# Patient Record
Sex: Female | Born: 1990 | Race: Black or African American | Hispanic: No | Marital: Single | Smoking: Former smoker
Health system: Southern US, Community
[De-identification: ages and names within clinical notes are randomized; demographics above are authoritative.]

## PROBLEM LIST (undated history)

## (undated) ENCOUNTER — Inpatient Hospital Stay (HOSPITAL_COMMUNITY): Payer: Self-pay

## (undated) DIAGNOSIS — F329 Major depressive disorder, single episode, unspecified: Secondary | ICD-10-CM

## (undated) DIAGNOSIS — A749 Chlamydial infection, unspecified: Secondary | ICD-10-CM

## (undated) DIAGNOSIS — F419 Anxiety disorder, unspecified: Secondary | ICD-10-CM

## (undated) DIAGNOSIS — E162 Hypoglycemia, unspecified: Secondary | ICD-10-CM

## (undated) DIAGNOSIS — F32A Depression, unspecified: Secondary | ICD-10-CM

## (undated) HISTORY — PX: NO PAST SURGERIES: SHX2092

---

## 2011-03-05 ENCOUNTER — Emergency Department (HOSPITAL_COMMUNITY): Payer: Federal, State, Local not specified - PPO

## 2011-03-05 ENCOUNTER — Emergency Department (HOSPITAL_COMMUNITY)
Admission: EM | Admit: 2011-03-05 | Discharge: 2011-03-05 | Disposition: A | Discharge status: Home / Self Care | Payer: Federal, State, Local not specified - PPO | Attending: Emergency Medicine | Admitting: Emergency Medicine

## 2011-03-05 ENCOUNTER — Encounter (HOSPITAL_COMMUNITY): Payer: Self-pay | Admitting: *Deleted

## 2011-03-05 ENCOUNTER — Other Ambulatory Visit: Payer: Self-pay

## 2011-03-05 DIAGNOSIS — R079 Chest pain, unspecified: Secondary | ICD-10-CM | POA: Insufficient documentation

## 2011-03-05 HISTORY — DX: Hypoglycemia, unspecified: E16.2

## 2011-03-05 HISTORY — DX: Anxiety disorder, unspecified: F41.9

## 2011-03-05 LAB — POCT I-STAT, CHEM 8
BUN: 13 mg/dL (ref 6–23)
Calcium, Ion: 1.22 mmol/L (ref 1.12–1.32)
Chloride: 107 mEq/L (ref 96–112)
Creatinine, Ser: 0.8 mg/dL (ref 0.50–1.10)
Glucose, Bld: 84 mg/dL (ref 70–99)
HCT: 42 % (ref 36.0–46.0)
Hemoglobin: 14.3 g/dL (ref 12.0–15.0)
Potassium: 3.7 mEq/L (ref 3.5–5.1)
Sodium: 139 mEq/L (ref 135–145)
TCO2: 24 mmol/L (ref 0–100)

## 2011-03-05 LAB — D-DIMER, QUANTITATIVE: D-Dimer, Quant: <0.22 ug/mL-FEU (ref 0.00–0.48)

## 2011-03-05 MED ORDER — IBUPROFEN 800 MG PO TABS
800.0000 mg | ORAL_TABLET | Freq: Once | ORAL | Status: AC
  Administered 2011-03-05: 800 mg via ORAL
  Filled 2011-03-05: qty 1

## 2011-03-05 MED ORDER — HYDROCODONE-ACETAMINOPHEN 5-325 MG PO TABS
1.0000 | ORAL_TABLET | Freq: Once | ORAL | Status: AC
  Administered 2011-03-05: 1 via ORAL
  Filled 2011-03-05: qty 1

## 2011-03-05 MED ORDER — HYDROCODONE-ACETAMINOPHEN 5-325 MG PO TABS: 1.0000 | ORAL_TABLET | Freq: Four times a day (QID) | ORAL | Status: AC | PRN

## 2011-03-05 NOTE — ED Notes (Signed)
Pt from home c/o pain under left breast that started 2 months ago, pain has been "coming and going" but has been getting worse over the last 24 hours. Pt endorses "a little" nausea and dizziness "off and on" but denies emesis. Pt denies recent injury or surgery.

## 2011-03-05 NOTE — ED Provider Notes (Signed)
History     CSN: [id number]  Arrival date & time 03/05/11  1421   First MD Initiated Contact with Patient 03/05/11 1524      Chief Complaint  Patient presents with  . Pleurisy    L rib cage area, under L breast    (Consider location/radiation/quality/duration/timing/severity/associated sxs/prior treatment) HPI Patient is a 21 yo F who presents complaining of 2 months of 8/10 sharp chest pain located underneath the left breast without radiation.  She is here today as she thinks this has gotten acutely worse in the past 24 hours.  She denies cough, fever, history of DVT or PE, history of CAD, early family history of CAD, or smoking.  There are no modifying or alleviating factors. Past Medical History  Diagnosis Date  . Hypoglycemia   . Anxiety     History reviewed. No pertinent past surgical history.  History reviewed. No pertinent family history.  History  Substance Use Topics  . Smoking status: Passive Smoker    Types: Cigarettes  . Smokeless tobacco: Never Used  . Alcohol Use: Yes     occ    OB History    Grav Para Term Preterm Abortions TAB SAB Ect Mult Living                  Review of Systems  Constitutional: Negative.   HENT: Negative.   Eyes: Negative.   Respiratory: Negative.   Cardiovascular: Positive for chest pain.  Gastrointestinal: Negative.   Genitourinary: Negative.   Musculoskeletal: Negative.   Skin: Negative.   Neurological: Negative.   Hematological: Negative.   Psychiatric/Behavioral: Negative.   All other systems reviewed and are negative.    Allergies  Food  Home Medications   Current Outpatient Rx  Name Route Sig Dispense Refill  . IBUPROFEN 200 MG PO TABS Oral Take 400 mg by mouth every 6 (six) hours as needed. pain    . LEVONORGESTREL-ETHINYL ESTRAD 0.1-20 MG-MCG PO TABS Oral Take 1 tablet by mouth daily.      BP 134/74  Pulse 78  Temp(Src) 98.8 F (37.1 C) (Oral)  Resp 16  Ht 5' 6.5" (1.689 m)  Wt 135 lb (61.236 kg)   BMI 21.46 kg/m2  SpO2 99%  LMP 02/19/2011  Physical Exam  Nursing note and vitals reviewed. Constitutional: She is oriented to person, place, and time. She appears well-developed and well-nourished. No distress.  HENT:  Head: Normocephalic and atraumatic.  Eyes: Conjunctivae and EOM are normal. Pupils are equal, round, and reactive to light.  Neck: Normal range of motion.  Cardiovascular: Normal rate, regular rhythm, normal heart sounds and intact distal pulses.  Exam reveals no gallop and no friction rub.   No murmur heard. Pulmonary/Chest: Effort normal and breath sounds normal. No respiratory distress. She has no wheezes. She has no rales.  Abdominal: Soft. Bowel sounds are normal. She exhibits no distension. There is no tenderness. There is no rebound and no guarding.  Musculoskeletal: Normal range of motion.  Neurological: She is alert and oriented to person, place, and time. No cranial nerve deficit. She exhibits normal muscle tone. Coordination normal.  Skin: Skin is warm and dry. No rash noted. No erythema.  Psychiatric: She has a normal mood and affect.    ED Course  Procedures (including critical care time)   Date: 03/13/2011  Rate: 64  Rhythm: normal sinus rhythm  QRS Axis: normal  Intervals: normal  ST/T Wave abnormalities: normal  Conduction Disutrbances:none  Narrative Interpretation:  Old EKG Reviewed: none available   Labs Reviewed  D-DIMER, QUANTITATIVE  POCT I-STAT, CHEM 8  LAB REPORT - SCANNED   No results found. DG CHEST 2 VIEW   Final Result:        1. Chest pain       MDM  Patient was well-appearing female in no acute distress.  Given her complaints CXR and ECG were performed.  Additionally i-stat chem 8 was performed to check for anemia. D-dimer was also performed and was negative.  Patient was treated with vicodin and ibuprofen.  Given the lack of obvious pathology causing the patient's symptoms she can continue OTC meds for her symptoms.   She was discharged in good condition.        Cyndra Numbers, MD 03/13/11 1130

## 2011-03-05 NOTE — ED Notes (Signed)
Pt states she developed left chest/breast pain a couple of months ago. States thought pain was gas related because severity was a 3 or 4. States that she bent over to put her pants on today and felt a sharp pain. States that pain is 10/10 with movement. Pt reports taking ibuprofen with little relief. Pt does report having a cough a couple of months ago when chest wall pain.

## 2011-09-14 ENCOUNTER — Encounter (HOSPITAL_COMMUNITY): Payer: Self-pay | Admitting: *Deleted

## 2011-09-14 ENCOUNTER — Inpatient Hospital Stay (HOSPITAL_COMMUNITY)
Admission: AD | Admit: 2011-09-14 | Discharge: 2011-09-14 | Disposition: A | Discharge status: Home / Self Care | Payer: Federal, State, Local not specified - PPO | Source: Ambulatory Visit | Attending: Obstetrics & Gynecology | Admitting: Obstetrics & Gynecology

## 2011-09-14 DIAGNOSIS — N949 Unspecified condition associated with female genital organs and menstrual cycle: Secondary | ICD-10-CM | POA: Insufficient documentation

## 2011-09-14 DIAGNOSIS — N921 Excessive and frequent menstruation with irregular cycle: Secondary | ICD-10-CM

## 2011-09-14 DIAGNOSIS — N925 Other specified irregular menstruation: Secondary | ICD-10-CM | POA: Insufficient documentation

## 2011-09-14 DIAGNOSIS — N938 Other specified abnormal uterine and vaginal bleeding: Secondary | ICD-10-CM | POA: Insufficient documentation

## 2011-09-14 HISTORY — DX: Chlamydial infection, unspecified: A74.9

## 2011-09-14 LAB — POCT PREGNANCY, URINE: Preg Test, Ur: NEGATIVE

## 2011-09-14 LAB — WET PREP, GENITAL
Clue Cells Wet Prep HPF POC: NONE SEEN
Trich, Wet Prep: NONE SEEN
Yeast Wet Prep HPF POC: NONE SEEN

## 2011-09-14 MED ORDER — LEVONORGESTREL-ETHINYL ESTRAD 0.1-20 MG-MCG PO TABS: 1.0000 | ORAL_TABLET | Freq: Every day | ORAL | Status: DC

## 2011-09-14 NOTE — MAU Note (Signed)
Patient states she has had some spotting daily since 8-3 after having a normal period on 7-24. Is taking oral contraceptives. Has had a little cramping off and on but none now.

## 2011-09-14 NOTE — MAU Provider Note (Signed)
Veronica Beasley y.o.G0P0 Chief Complaint  Patient presents with  . Vaginal Bleeding     First Provider Initiated Contact with Patient 09/14/11 1311      SUBJECTIVE  HPI: Presents with a six-day history of light vaginal bleeding. She's on week 2 of Alesse  pill pack. Of note she missed one pill last month and the next day took 2. Concerned that she's never had  break through bleeding before while on oral contraceptives and has been taking them for 6 years. No abnormal Pap smears in the past. Denies new sexual partner. No irritative vaginal discharge. Would like prescription refill. Primary care is at [facility] urgent care.  Past Medical History  Diagnosis Date  . Hypoglycemia   . Chlamydia   . Anxiety     denies   Past Surgical History  Procedure Date  . No past surgeries    History   Social History  . Marital Status: Single    Spouse Name: N/A    Number of Children: N/A  . Years of Education: N/A   Occupational History  . Not on file.   Social History Main Topics  . Smoking status: Former Games developer  . Smokeless tobacco: Never Used   Comment: briefly at age 83  . Alcohol Use: Yes     occ  . Drug Use: No  . Sexually Active: Yes    Birth Control/ Protection: Pill   Other Topics Concern  . Not on file   Social History Narrative  . No narrative on file   No current facility-administered medications on file prior to encounter.   Current Outpatient Prescriptions on File Prior to Encounter  Medication Sig Dispense Refill  . levonorgestrel-ethinyl estradiol (AVIANE,ALESSE,LESSINA) 0.1-20 MG-MCG tablet Take 1 tablet by mouth daily.       Allergies  Allergen Reactions  . Food Hives and Itching    Ceasar Dressing    ROS: Pertinent items in HPI  OBJECTIVE Blood pressure 144/88, pulse 84, temperature 99.4 F (37.4 C), temperature source Oral, resp. rate 16, height 5' 5.5" (1.664 m), weight 60.873 kg (134 lb 3.2 oz), last menstrual period 08/30/2011, SpO2  100.00%.  GENERAL: Well-developed, well-nourished female in no acute distress.  HEENT: Normocephalic, good dentition HEART: normal rate RESP: normal effort ABDOMEN: Soft, nontender EXTREMITIES: Nontender, no edema NEURO: Alert and oriented SPECULUM EXAM: NEFG, physiologic discharge, scant blood noted, cervix clean BIMANUAL: cervix nulliparous; uterus NSSP; no adnexal tenderness or masses   LAB RESULTS  Results for orders placed during the hospital encounter of 09/14/11 (from the past 24 hour(s))  POCT PREGNANCY, URINE     Status: Normal   Collection Time   09/14/11 12:37 PM      Component Value Range   Preg Test, Ur NEGATIVE  NEGATIVE  WET PREP, GENITAL     Status: Abnormal   Collection Time   09/14/11  1:20 PM      Component Value Range   Yeast Wet Prep HPF POC NONE SEEN  NONE SEEN   Trich, Wet Prep NONE SEEN  NONE SEEN   Clue Cells Wet Prep HPF POC NONE SEEN  NONE SEEN   WBC, Wet Prep HPF POC FEW (*) NONE SEEN       ASSESSMENT  Breakthrough bleeding on oral contraceptives due to missed pill  PLAN   Medication List  As of 09/14/2011  1:24 PM   ASK your doctor about these medications         levonorgestrel-ethinyl estradiol 0.1-20 MG-MCG tablet  Commonly known as: AVIANE,ALESSE,LESSINA   Take 1 tablet by mouth daily.             Follow-up Information    Schedule an appointment as soon as possible for a visit in 3 months to follow up. (Followup with primary M.D. at [facility] if breakthrough bleeding continues)        GC/CT sent Refilled levonorgestrel-ethinyl estradiol .1-20 mg-mcg x 6 months Reviewed pill regimen Use back up method for 1 cycle    Mardell Suttles 09/14/2011 1:24 PM

## 2011-09-14 NOTE — MAU Note (Signed)
Mid cycle bleeding, spotting for past 6 days. Has been on birth control pills off and on since age 21, this type for almost a year, never had irreg bleeding.  occ mild cramping in lower abd, none today

## 2011-09-15 LAB — GC/CHLAMYDIA PROBE AMP, GENITAL
Chlamydia, DNA Probe: NEGATIVE
GC Probe Amp, Genital: NEGATIVE

## 2011-10-24 ENCOUNTER — Ambulatory Visit (INDEPENDENT_AMBULATORY_CARE_PROVIDER_SITE_OTHER): Payer: Federal, State, Local not specified - PPO | Admitting: Emergency Medicine

## 2011-10-24 VITALS — BP 118/78 | HR 60 | Temp 98.8°F | Resp 16 | Ht 66.5 in | Wt 134.0 lb

## 2011-10-24 DIAGNOSIS — S335XXA Sprain of ligaments of lumbar spine, initial encounter: Secondary | ICD-10-CM

## 2011-10-24 DIAGNOSIS — Z309 Encounter for contraceptive management, unspecified: Secondary | ICD-10-CM

## 2011-10-24 MED ORDER — CYCLOBENZAPRINE HCL 10 MG PO TABS: 10.0000 mg | ORAL_TABLET | Freq: Three times a day (TID) | ORAL | Status: DC | PRN

## 2011-10-24 MED ORDER — NAPROXEN SODIUM 550 MG PO TABS: 550.0000 mg | ORAL_TABLET | Freq: Two times a day (BID) | ORAL | Status: DC

## 2011-10-24 MED ORDER — LEVONORGESTREL-ETHINYL ESTRAD 0.1-20 MG-MCG PO TABS: 1.0000 | ORAL_TABLET | Freq: Every day | ORAL | Status: DC

## 2011-10-24 NOTE — Progress Notes (Signed)
   Date:  10/24/2011   Name:  Veronica Beasley   DOB:  [birth year]   MRN:  [id number] Gender: female Age: 21 y.o.  PCP:  No primary provider on file.    Chief Complaint: Back Pain and Medication Refill   History of Present Illness:  Veronica Beasley is a 21 y.o. 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  She has developed low back pain that bothers her frequently.  No radiation or neurological symptoms.  No history of injury. Says pain is in low back and occurs on both sides and is something she has relatively frequently.  No GU or GYN symptoms  Requesting a refill on her OCP.  No history of STD or missed pills.  There is no problem list on file for this patient.   Past Medical History  Diagnosis Date  . Hypoglycemia   . Chlamydia   . Anxiety     denies    Past Surgical History  Procedure Date  . No past surgeries     History  Substance Use Topics  . Smoking status: Former Games developer  . Smokeless tobacco: Never Used   Comment: briefly at age 89  . Alcohol Use: Yes     occ    Family History  Problem Relation Age of Onset  . Other Neg Hx     Allergies  Allergen Reactions  . Food Hives and Itching    Ceasar Dressing    Medication list has been reviewed and updated.  Outpatient Prescriptions Prior to Visit  Medication Sig Dispense Refill  . levonorgestrel-ethinyl estradiol (AVIANE,ALESSE,LESSINA) 0.1-20 MG-MCG tablet Take 1 tablet by mouth daily.  1 Package  3    Review of Systems:  As per HPI, otherwise negative.    Physical Examination: Filed Vitals:   10/24/11 1615  BP: 118/78  Pulse: 60  Temp: 98.8 F (37.1 C)  Resp: 16   Filed Vitals:   10/24/11 1615  Height: 5' 6.5" (1.689 m)  Weight: 134 lb (60.782 kg)   Body mass index is 21.30 kg/(m^2). Ideal Body Weight: Weight in (lb) to have BMI = 25: 156.9    GEN: WDWN, NAD, Non-toxic, Alert & Oriented x 3 HEENT: Atraumatic,  Normocephalic.  Ears and Nose: No external deformity. EXTR: No clubbing/cyanosis/edema NEURO: Normal gait.  PSYCH: Normally interactive. Conversant. Not depressed or anxious appearing.  Calm demeanor.  BACK:  Tender in lumbar paravertebral muscles.  Some spasm.  Assessment and Plan: Lumbar strain Contraceptive management Flexeril Anaprox Follow up as needed  Veronica Dane, MD  I have reviewed and agree with documentation. Robert P. Merla Riches, M.D.

## 2011-12-06 ENCOUNTER — Telehealth: Payer: Self-pay | Admitting: Physician Assistant

## 2011-12-06 ENCOUNTER — Other Ambulatory Visit: Payer: Self-pay | Admitting: Physician Assistant

## 2011-12-06 MED ORDER — FLUTICASONE PROPIONATE 50 MCG/ACT NA SUSP: 2.0000 | Freq: Every day | NASAL | Status: DC

## 2011-12-06 NOTE — Telephone Encounter (Signed)
Pt here for work comp visit, requests allergy medication as well.  Has been using Zyrtec and Zyrtec-D but still complaining of allergic symptoms.  Have ordered Flonase for her and encouraged her to continue Zyrtec.  Discouraged further use of Zyrtec-D.  Instructed patient to follow-up here in several weeks, separate from work comp visit, to reassess allergic symptoms.

## 2012-03-07 ENCOUNTER — Emergency Department (HOSPITAL_COMMUNITY)
Admission: EM | Admit: 2012-03-07 | Discharge: 2012-03-07 | Disposition: A | Discharge status: Home / Self Care | Payer: Federal, State, Local not specified - PPO | Attending: Emergency Medicine | Admitting: Emergency Medicine

## 2012-03-07 DIAGNOSIS — R110 Nausea: Secondary | ICD-10-CM | POA: Insufficient documentation

## 2012-03-07 DIAGNOSIS — J3489 Other specified disorders of nose and nasal sinuses: Secondary | ICD-10-CM | POA: Insufficient documentation

## 2012-03-07 DIAGNOSIS — B9789 Other viral agents as the cause of diseases classified elsewhere: Secondary | ICD-10-CM | POA: Insufficient documentation

## 2012-03-07 DIAGNOSIS — B349 Viral infection, unspecified: Secondary | ICD-10-CM

## 2012-03-07 DIAGNOSIS — Z3202 Encounter for pregnancy test, result negative: Secondary | ICD-10-CM | POA: Insufficient documentation

## 2012-03-07 DIAGNOSIS — R197 Diarrhea, unspecified: Secondary | ICD-10-CM | POA: Insufficient documentation

## 2012-03-07 DIAGNOSIS — Z862 Personal history of diseases of the blood and blood-forming organs and certain disorders involving the immune mechanism: Secondary | ICD-10-CM | POA: Insufficient documentation

## 2012-03-07 DIAGNOSIS — Z87891 Personal history of nicotine dependence: Secondary | ICD-10-CM | POA: Insufficient documentation

## 2012-03-07 DIAGNOSIS — Z8619 Personal history of other infectious and parasitic diseases: Secondary | ICD-10-CM | POA: Insufficient documentation

## 2012-03-07 DIAGNOSIS — Z8659 Personal history of other mental and behavioral disorders: Secondary | ICD-10-CM | POA: Insufficient documentation

## 2012-03-07 DIAGNOSIS — Z8639 Personal history of other endocrine, nutritional and metabolic disease: Secondary | ICD-10-CM | POA: Insufficient documentation

## 2012-03-07 DIAGNOSIS — R05 Cough: Secondary | ICD-10-CM | POA: Insufficient documentation

## 2012-03-07 DIAGNOSIS — Z79899 Other long term (current) drug therapy: Secondary | ICD-10-CM | POA: Insufficient documentation

## 2012-03-07 DIAGNOSIS — R059 Cough, unspecified: Secondary | ICD-10-CM | POA: Insufficient documentation

## 2012-03-07 LAB — WET PREP, GENITAL
Clue Cells Wet Prep HPF POC: NONE SEEN
Trich, Wet Prep: NONE SEEN
Yeast Wet Prep HPF POC: NONE SEEN

## 2012-03-07 LAB — POCT I-STAT, CHEM 8
BUN: 11 mg/dL (ref 6–23)
Calcium, Ion: 1.15 mmol/L (ref 1.12–1.23)
Chloride: 107 mEq/L (ref 96–112)
Creatinine, Ser: 0.8 mg/dL (ref 0.50–1.10)
Glucose, Bld: 72 mg/dL (ref 70–99)
HCT: 44 % (ref 36.0–46.0)
Hemoglobin: 15 g/dL (ref 12.0–15.0)
Potassium: 3.9 mEq/L (ref 3.5–5.1)
Sodium: 138 mEq/L (ref 135–145)
TCO2: 23 mmol/L (ref 0–100)

## 2012-03-07 LAB — CBC WITH DIFFERENTIAL/PLATELET
Basophils Absolute: 0 10*3/uL (ref 0.0–0.1)
Basophils Relative: 0 % (ref 0–1)
Eosinophils Absolute: 0.1 10*3/uL (ref 0.0–0.7)
Eosinophils Relative: 1 % (ref 0–5)
HCT: 43.5 % (ref 36.0–46.0)
Hemoglobin: 14.8 g/dL (ref 12.0–15.0)
Lymphocytes Relative: 24 % (ref 12–46)
Lymphs Abs: 2 10*3/uL (ref 0.7–4.0)
MCH: 28.6 pg (ref 26.0–34.0)
MCHC: 34 g/dL (ref 30.0–36.0)
MCV: 84 fL (ref 78.0–100.0)
Monocytes Absolute: 1 10*3/uL (ref 0.1–1.0)
Monocytes Relative: 12 % (ref 3–12)
Neutro Abs: 5.1 10*3/uL (ref 1.7–7.7)
Neutrophils Relative %: 63 % (ref 43–77)
Platelets: 268 10*3/uL (ref 150–400)
RBC: 5.18 MIL/uL — ABNORMAL HIGH (ref 3.87–5.11)
RDW: 12.3 % (ref 11.5–15.5)
WBC: 8.2 10*3/uL (ref 4.0–10.5)

## 2012-03-07 LAB — URINALYSIS, ROUTINE W REFLEX MICROSCOPIC
Bilirubin Urine: NEGATIVE
Glucose, UA: NEGATIVE mg/dL
Hgb urine dipstick: NEGATIVE
Ketones, ur: 15 mg/dL — AB
Leukocytes, UA: NEGATIVE
Nitrite: NEGATIVE
Protein, ur: NEGATIVE mg/dL
Specific Gravity, Urine: 1.022 (ref 1.005–1.030)
Urobilinogen, UA: 0.2 mg/dL (ref 0.0–1.0)
pH: 6.5 (ref 5.0–8.0)

## 2012-03-07 LAB — OCCULT BLOOD, POC DEVICE: Fecal Occult Bld: NEGATIVE

## 2012-03-07 LAB — PREGNANCY, URINE: Preg Test, Ur: NEGATIVE

## 2012-03-07 MED ORDER — MORPHINE SULFATE 4 MG/ML IJ SOLN
4.0000 mg | Freq: Once | INTRAMUSCULAR | Status: AC
  Administered 2012-03-07: 4 mg via INTRAVENOUS
  Filled 2012-03-07: qty 1

## 2012-03-07 MED ORDER — SODIUM CHLORIDE 0.9 % IV BOLUS (SEPSIS)
1000.0000 mL | Freq: Once | INTRAVENOUS | Status: AC
  Administered 2012-03-07: 1000 mL via INTRAVENOUS

## 2012-03-07 MED ORDER — ONDANSETRON HCL 4 MG/2ML IJ SOLN
4.0000 mg | Freq: Once | INTRAMUSCULAR | Status: AC
  Administered 2012-03-07: 4 mg via INTRAVENOUS
  Filled 2012-03-07: qty 2

## 2012-03-07 NOTE — Progress Notes (Signed)
Pt confirms she does not have a pcp but has blue cross blue shield coverage and is aware of how to obtain a pcp for follow up care as needed

## 2012-03-07 NOTE — ED Notes (Signed)
Pt present with c/o abd pain, nausea, gagging, lightheaded and weakness.  Pt reports frequent bowel moventsover the last few days.  Pt denies chills or fever.

## 2012-03-07 NOTE — ED Provider Notes (Signed)
History     CSN: [id number]  Arrival date & time 03/07/12  1446   First MD Initiated Contact with Patient 03/07/12 1709      Chief Complaint  Patient presents with  . Abdominal Pain    (Consider location/radiation/quality/duration/timing/severity/associated sxs/prior treatment) HPI  22 year old female  presents complaining of abdominal pain.  Patient reports for the past week she has URI symptoms with sneezing, runny nose, nasal congestion, nonproductive cough. For the past 5 days she also endorsed nausea, abdominal cramping, and having diarrhea. Diarrhea is nonbloody non-mucousy. Abdominal cramping is intermittent, to mid abdomen, nonradiating, mild to moderate in severity. Due to the symptoms been persistent she is concerning, to ER for further evaluation. She denies fever, chills, chest pain, shortness of breath, hemoptysis, night pain, dysuria, hematuria, or melena. She denies any recent travel or any exotic food. Her last menstrual period was 3 weeks ago. She is sexually active. Has prior history of Chlamydia. Denies any vaginal discharge. No prior history of abdominal surgery, no history of appendicitis.  Past Medical History  Diagnosis Date  . Hypoglycemia   . Chlamydia   . Anxiety     denies    Past Surgical History  Procedure Date  . No past surgeries     Family History  Problem Relation Age of Onset  . Other Neg Hx     History  Substance Use Topics  . Smoking status: Former Games developer  . Smokeless tobacco: Never Used     Comment: briefly at age 27  . Alcohol Use: Yes     Comment: occ    OB History    Grav Para Term Preterm Abortions TAB SAB Ect Mult Living   0               Review of Systems  Constitutional:       10 Systems reviewed and all are negative for acute change except as noted in the HPI.     Allergies  Food  Home Medications   Current Outpatient Rx  Name  Route  Sig  Dispense  Refill  . LEVONORGESTREL-ETHINYL ESTRAD 0.1-20 MG-MCG PO  TABS   Oral   Take 1 tablet by mouth daily.   3 Package   3     BP 117/77  Pulse 83  Temp 99 F (37.2 C) (Oral)  Resp 20  SpO2 100%  LMP 02/15/2012  Physical Exam  Nursing note and vitals reviewed. Constitutional: She appears well-developed and well-nourished. No distress.  HENT:  Head: Normocephalic and atraumatic.  Eyes: Conjunctivae normal are normal.  Neck: Normal range of motion. Neck supple.  Cardiovascular: Normal rate and regular rhythm.   Pulmonary/Chest: Effort normal and breath sounds normal. She exhibits no tenderness.  Abdominal: Soft. There is no tenderness.       Mild suprapubic and mid abdominal tenderness.    Negative Murphy's sign, no McBurney's point, negative psoas or obturator sign.    Genitourinary: Vagina normal and uterus normal. There is no rash or lesion on the right labia. There is no rash or lesion on the left labia. Cervix exhibits no motion tenderness and no discharge. Right adnexum displays no mass and no tenderness. Left adnexum displays no mass and no tenderness. No erythema, tenderness or bleeding around the vagina. No vaginal discharge found.  Lymphadenopathy:       Right: No inguinal adenopathy present.       Left: No inguinal adenopathy present.    ED Course  Procedures (including critical  care time)  Results for orders placed during the hospital encounter of 03/07/12  CBC WITH DIFFERENTIAL      Component Value Range   WBC 8.2  4.0 - 10.5 K/uL   RBC 5.18 (*) 3.87 - 5.11 MIL/uL   Hemoglobin 14.8  12.0 - 15.0 g/dL   HCT 11.9  14.7 - 82.9 %   MCV 84.0  78.0 - 100.0 fL   MCH 28.6  26.0 - 34.0 pg   MCHC 34.0  30.0 - 36.0 g/dL   RDW 56.2  13.0 - 86.5 %   Platelets 268  150 - 400 K/uL   Neutrophils Relative 63  43 - 77 %   Neutro Abs 5.1  1.7 - 7.7 K/uL   Lymphocytes Relative 24  12 - 46 %   Lymphs Abs 2.0  0.7 - 4.0 K/uL   Monocytes Relative 12  3 - 12 %   Monocytes Absolute 1.0  0.1 - 1.0 K/uL   Eosinophils Relative 1  0 - 5 %    Eosinophils Absolute 0.1  0.0 - 0.7 K/uL   Basophils Relative 0  0 - 1 %   Basophils Absolute 0.0  0.0 - 0.1 K/uL  URINALYSIS, ROUTINE W REFLEX MICROSCOPIC      Component Value Range   Color, Urine YELLOW  YELLOW   APPearance CLEAR  CLEAR   Specific Gravity, Urine 1.022  1.005 - 1.030   pH 6.5  5.0 - 8.0   Glucose, UA NEGATIVE  NEGATIVE mg/dL   Hgb urine dipstick NEGATIVE  NEGATIVE   Bilirubin Urine NEGATIVE  NEGATIVE   Ketones, ur 15 (*) NEGATIVE mg/dL   Protein, ur NEGATIVE  NEGATIVE mg/dL   Urobilinogen, UA 0.2  0.0 - 1.0 mg/dL   Nitrite NEGATIVE  NEGATIVE   Leukocytes, UA NEGATIVE  NEGATIVE  PREGNANCY, URINE      Component Value Range   Preg Test, Ur NEGATIVE  NEGATIVE  WET PREP, GENITAL      Component Value Range   Yeast Wet Prep HPF POC NONE SEEN  NONE SEEN   Trich, Wet Prep NONE SEEN  NONE SEEN   Clue Cells Wet Prep HPF POC NONE SEEN  NONE SEEN   WBC, Wet Prep HPF POC MODERATE (*) NONE SEEN  OCCULT BLOOD, POC DEVICE      Component Value Range   Fecal Occult Bld NEGATIVE  NEGATIVE  POCT I-STAT, CHEM 8      Component Value Range   Sodium 138  135 - 145 mEq/L   Potassium 3.9  3.5 - 5.1 mEq/L   Chloride 107  96 - 112 mEq/L   BUN 11  6 - 23 mg/dL   Creatinine, Ser 7.84  0.50 - 1.10 mg/dL   Glucose, Bld 72  70 - 99 mg/dL   Calcium, Ion 6.96  2.95 - 1.23 mmol/L   TCO2 23  0 - 100 mmol/L   Hemoglobin 15.0  12.0 - 15.0 g/dL   HCT 28.4  13.2 - 44.0 %   No results found.   7:58 PM Pt with viral syndrome.  Work up unremarkable. Pt felt better after receiving IVF.  Her work up unremarkable.  Pt tolerates PO.  Stable for discharge.  Return precaution given. Doubt appendicitis or PID. No diarrhea here in ER.    MDM  1. Viral syndrome  BP 121/72  Pulse 75  Temp 99.3 F (37.4 C) (Oral)  Resp 18  SpO2 100%  LMP 02/15/2012  I have  reviewed nursing notes and vital signs.  I reviewed available ER/hospitalization records thought the EMR         Fayrene Helper,  [place] 03/07/12 1610

## 2012-03-08 LAB — GC/CHLAMYDIA PROBE AMP
CT Probe RNA: NEGATIVE
GC Probe RNA: NEGATIVE

## 2012-03-08 NOTE — ED Provider Notes (Signed)
Medical screening examination/treatment/procedure(s) were performed by non-physician practitioner and as supervising physician I was immediately available for consultation/collaboration.  Patrecia Veiga, MD 03/08/12 0711 

## 2013-02-06 NOTE — L&D Delivery Note (Signed)
VAVD of VMI at 0845 on 11/03/13.  EBL 300cc.  Placenta to L&D.  APGARs 8,9. Patient pushing effectively however tracing with deep variables with each CTX.  Vertex at +3 station.  After consenting the patient for vacuum, Kiwi was applied and with 3 push/pull efforts and 2 pop offs, the head delivered ROA.  Body delivered atraumatically.  Mouth and nose were bulb suctioned.  Cord was clamped, cut and baby to abdomen.  Cord blood was obtained.  Placenta delivered S/I/3VC.  Fundus firmed with pitocin and massage.  2nd degree perineal lac repaired with 3-0 Rapide in the normal fashion.  Bilateral periurethral lac hemostatic.  Figure of 8 stitch was placed using 3-0 Rapide on the larger right-sided lac.  Mom and baby stable.    Mitchel Honour, DO

## 2013-03-09 ENCOUNTER — Encounter (HOSPITAL_COMMUNITY): Payer: Self-pay

## 2013-03-09 ENCOUNTER — Inpatient Hospital Stay (HOSPITAL_COMMUNITY)
Admission: AD | Admit: 2013-03-09 | Discharge: 2013-03-09 | Disposition: A | Discharge status: Home / Self Care | Payer: Federal, State, Local not specified - PPO | Source: Ambulatory Visit | Attending: Obstetrics and Gynecology | Admitting: Obstetrics and Gynecology

## 2013-03-09 DIAGNOSIS — O9989 Other specified diseases and conditions complicating pregnancy, childbirth and the puerperium: Principal | ICD-10-CM

## 2013-03-09 DIAGNOSIS — O99891 Other specified diseases and conditions complicating pregnancy: Secondary | ICD-10-CM | POA: Insufficient documentation

## 2013-03-09 DIAGNOSIS — R42 Dizziness and giddiness: Secondary | ICD-10-CM | POA: Insufficient documentation

## 2013-03-09 DIAGNOSIS — Z87891 Personal history of nicotine dependence: Secondary | ICD-10-CM | POA: Insufficient documentation

## 2013-03-09 DIAGNOSIS — O99340 Other mental disorders complicating pregnancy, unspecified trimester: Secondary | ICD-10-CM | POA: Insufficient documentation

## 2013-03-09 DIAGNOSIS — F411 Generalized anxiety disorder: Secondary | ICD-10-CM

## 2013-03-09 DIAGNOSIS — R109 Unspecified abdominal pain: Secondary | ICD-10-CM | POA: Insufficient documentation

## 2013-03-09 HISTORY — DX: Depression, unspecified: F32.A

## 2013-03-09 HISTORY — DX: Major depressive disorder, single episode, unspecified: F32.9

## 2013-03-09 LAB — GLUCOSE, CAPILLARY: Glucose-Capillary: 79 mg/dL (ref 70–99)

## 2013-03-09 LAB — POCT PREGNANCY, URINE: Preg Test, Ur: POSITIVE — AB

## 2013-03-09 LAB — URINALYSIS, ROUTINE W REFLEX MICROSCOPIC
Bilirubin Urine: NEGATIVE
Glucose, UA: NEGATIVE mg/dL
Hgb urine dipstick: NEGATIVE
Ketones, ur: NEGATIVE mg/dL
Leukocytes, UA: NEGATIVE
Nitrite: NEGATIVE
Protein, ur: NEGATIVE mg/dL
Specific Gravity, Urine: 1.02 (ref 1.005–1.030)
Urobilinogen, UA: 0.2 mg/dL (ref 0.0–1.0)
pH: 8 (ref 5.0–8.0)

## 2013-03-09 MED ORDER — HYDROXYZINE PAMOATE 50 MG PO CAPS: 50.0000 mg | ORAL_CAPSULE | Freq: Three times a day (TID) | ORAL | Status: DC | PRN

## 2013-03-09 MED ORDER — ONDANSETRON 8 MG PO TBDP: 8.0000 mg | ORAL_TABLET | Freq: Three times a day (TID) | ORAL | Status: DC | PRN

## 2013-03-09 MED ORDER — ONDANSETRON 8 MG PO TBDP
8.0000 mg | ORAL_TABLET | Freq: Once | ORAL | Status: AC
  Administered 2013-03-09: 8 mg via ORAL
  Filled 2013-03-09: qty 1

## 2013-03-09 NOTE — MAU Provider Note (Signed)
History     CSN: [id number]  Arrival date and time: 03/09/13 1108   First Provider Initiated Contact with Patient 03/09/13 1159      Chief Complaint  Patient presents with  . Dizziness   HPI Veronica Beasley 23 y.o. Early pregnant.  Comes to MAU with dizziness.  Was up most of the night fighting with her boyfriend.  Has not eaten since last night.  Feeling dizzy and having abdominal pain.  Has been seen in the office and her next appointment is on Tuesday.  Has a history of anxiety.  Is feeling much better after resting in bed and having some peanut butter and crackers.  [place] in [place].  Works at [employer] as a Psychologist, sport and exercisenurse tech.  OB History   Grav Para Term Preterm Abortions TAB SAB Ect Mult Living   1               Past Medical History  Diagnosis Date  . Hypoglycemia   . Chlamydia   . Anxiety   . Depression     Past Surgical History  Procedure Laterality Date  . No past surgeries      Family History  Problem Relation Age of Onset  . Other Neg Hx     History  Substance Use Topics  . Smoking status: Former Games developermoker  . Smokeless tobacco: Never Used     Comment: briefly at age 23  . Alcohol Use: No     Comment: occ    Allergies:  Allergies  Allergen Reactions  . Food Hives and Itching    Ceasar Dressing    No prescriptions prior to admission    Review of Systems  Constitutional: Negative for fever.  Gastrointestinal: Positive for nausea. Negative for abdominal pain.  Genitourinary: Negative for dysuria.  Neurological: Positive for dizziness.   Physical Exam   Blood pressure 122/76, pulse 70, temperature 98.5 F (36.9 C), temperature source Oral, resp. rate 18, height 5\' 7"  (1.702 m), weight 145 lb (65.772 kg), not currently breastfeeding.  Physical Exam  Nursing note and vitals reviewed. Constitutional: She is oriented to person, place, and time. She appears well-developed and well-nourished.  Reports no pain at present.  HENT:   Head: Normocephalic.  Eyes: EOM are normal.  Neck: Neck supple.  Respiratory: Effort normal.  Musculoskeletal: Normal range of motion.  Neurological: She is alert and oriented to person, place, and time.  Skin: Skin is warm and dry.  Psychiatric: She has a normal mood and affect.    MAU Course  Procedures  MDM Results for orders placed during the hospital encounter of 03/09/13 (from the past 24 hour(s))  URINALYSIS, ROUTINE W REFLEX MICROSCOPIC     Status: None   Collection Time    03/09/13 11:30 AM      Result Value Range   Color, Urine YELLOW  YELLOW   APPearance CLEAR  CLEAR   Specific Gravity, Urine 1.020  1.005 - 1.030   pH 8.0  5.0 - 8.0   Glucose, UA NEGATIVE  NEGATIVE mg/dL   Hgb urine dipstick NEGATIVE  NEGATIVE   Bilirubin Urine NEGATIVE  NEGATIVE   Ketones, ur NEGATIVE  NEGATIVE mg/dL   Protein, ur NEGATIVE  NEGATIVE mg/dL   Urobilinogen, UA 0.2  0.0 - 1.0 mg/dL   Nitrite NEGATIVE  NEGATIVE   Leukocytes, UA NEGATIVE  NEGATIVE  GLUCOSE, CAPILLARY     Status: None   Collection Time    03/09/13 11:36 AM  Result Value Range   Glucose-Capillary 79  70 - 99 mg/dL  POCT PREGNANCY, URINE     Status: Abnormal   Collection Time    03/09/13 11:40 AM      Result Value Range   Preg Test, Ur POSITIVE (*) NEGATIVE    Assessment and Plan  Dizziness in early pregnancy Anxiety  Plan RX Zofran for nausea Rx vistaril 50 mg one PO q 6 hours for anxiety (#12) no refills for anxiety Keep your appointment in the office on Tuesday. Be sure to eat every 2-4 hours and include a protein source every time you eat.  BURLESON,TERRI 03/09/2013, 12:57 PM

## 2013-03-09 NOTE — MAU Note (Signed)
Pt states was switched three months ago to Lo-Estrin FE, took home upt Saturday night, then [facility]. Denies bleeding or abnormal vaginal discharge. Began having n/v today, denies diarrhea. Had argument with partner and since then has felt bad. Was very shaky and felt hypoglycemic when first arriving to MAU. CBG 79. Having dull aching at present, however pain last pm woke pt up out of her sleep.

## 2013-03-09 NOTE — Discharge Instructions (Signed)
Keep your appointment in the office on Tuesday. Be sure to eat every 2-4 hours and include a protein source every time you eat. Return if you develop worsening abdominal pain or vaginal bleeding.

## 2013-05-27 ENCOUNTER — Inpatient Hospital Stay (HOSPITAL_COMMUNITY)
Admission: AD | Admit: 2013-05-27 | Discharge: 2013-05-27 | Disposition: A | Discharge status: Home / Self Care | Payer: Federal, State, Local not specified - PPO | Source: Ambulatory Visit | Attending: Obstetrics and Gynecology | Admitting: Obstetrics and Gynecology

## 2013-05-27 ENCOUNTER — Encounter (HOSPITAL_COMMUNITY): Payer: Self-pay | Admitting: *Deleted

## 2013-05-27 DIAGNOSIS — O9989 Other specified diseases and conditions complicating pregnancy, childbirth and the puerperium: Principal | ICD-10-CM

## 2013-05-27 DIAGNOSIS — R42 Dizziness and giddiness: Secondary | ICD-10-CM

## 2013-05-27 DIAGNOSIS — Z87891 Personal history of nicotine dependence: Secondary | ICD-10-CM | POA: Insufficient documentation

## 2013-05-27 DIAGNOSIS — R0602 Shortness of breath: Secondary | ICD-10-CM | POA: Insufficient documentation

## 2013-05-27 DIAGNOSIS — O99891 Other specified diseases and conditions complicating pregnancy: Secondary | ICD-10-CM | POA: Insufficient documentation

## 2013-05-27 DIAGNOSIS — O26899 Other specified pregnancy related conditions, unspecified trimester: Secondary | ICD-10-CM

## 2013-05-27 LAB — URINALYSIS, ROUTINE W REFLEX MICROSCOPIC
Bilirubin Urine: NEGATIVE
Glucose, UA: NEGATIVE mg/dL
Hgb urine dipstick: NEGATIVE
Ketones, ur: NEGATIVE mg/dL
Leukocytes, UA: NEGATIVE
Nitrite: NEGATIVE
Protein, ur: NEGATIVE mg/dL
Specific Gravity, Urine: 1.015 (ref 1.005–1.030)
Urobilinogen, UA: 0.2 mg/dL (ref 0.0–1.0)
pH: 8.5 — ABNORMAL HIGH (ref 5.0–8.0)

## 2013-05-27 LAB — CBC
HCT: 33.9 % — ABNORMAL LOW (ref 36.0–46.0)
Hemoglobin: 11.6 g/dL — ABNORMAL LOW (ref 12.0–15.0)
MCH: 28.9 pg (ref 26.0–34.0)
MCHC: 34.2 g/dL (ref 30.0–36.0)
MCV: 84.5 fL (ref 78.0–100.0)
Platelets: 219 10*3/uL (ref 150–400)
RBC: 4.01 MIL/uL (ref 3.87–5.11)
RDW: 13.6 % (ref 11.5–15.5)
WBC: 10.1 10*3/uL (ref 4.0–10.5)

## 2013-05-27 LAB — GLUCOSE, CAPILLARY: Glucose-Capillary: 66 mg/dL — ABNORMAL LOW (ref 70–99)

## 2013-05-27 NOTE — MAU Provider Note (Signed)
History     CSN: [id number]  Arrival date and time: 05/27/13 1345   First Provider Initiated Contact with Patient 05/27/13 1449      Chief Complaint  Patient presents with  . Shortness of Breath  . Dizziness  . Nausea   HPI  Veronica Beasley is a 23 y.o. female G1P0 at 3585w1d who presents with shortness of breath. The patient was taking a shower, and got out to get dressed and felt like she was short of breath. While she was walking into the unit she started feeling SOB again. The patient does have a history of anxiety and worries about the baby when she has shortness of breath episodes. The patient states she has not had anything to eat today because the shortness of breath has caused her to have a decreased appetite. The patient is present with her boyfriend. I asked him to step out and I discussed patient's history of anxiety/ depression with her solo. The patient verbalizes that she is safe in her relationship; she denies depression symptoms, denies the thought of harm to herself or anyone else. She does not feel the symptoms are related to her anxiety.  She is scheduled next week with Dr.Tomblin for her prenatal appointment.     OB History   Grav Para Term Preterm Abortions TAB SAB Ect Mult Living   1               Past Medical History  Diagnosis Date  . Hypoglycemia   . Chlamydia   . Anxiety   . Depression     Past Surgical History  Procedure Laterality Date  . No past surgeries      Family History  Problem Relation Age of Onset  . Other Neg Hx     History  Substance Use Topics  . Smoking status: Former Games developermoker  . Smokeless tobacco: Never Used     Comment: briefly at age 23  . Alcohol Use: No     Comment: occ    Allergies:  Allergies  Allergen Reactions  . Food Hives and Itching    Ceasar Dressing    Prescriptions prior to admission  Medication Sig Dispense Refill  . Prenatal Vit-Fe Fumarate-FA (PRENATAL MULTIVITAMIN) TABS tablet Take 1 tablet by  mouth daily at 12 noon.       Results for orders placed during the hospital encounter of 05/27/13 (from the past 48 hour(s))  URINALYSIS, ROUTINE W REFLEX MICROSCOPIC     Status: Abnormal   Collection Time    05/27/13  2:05 PM      Result Value Ref Range   Color, Urine YELLOW  YELLOW   APPearance CLEAR  CLEAR   Specific Gravity, Urine 1.015  1.005 - 1.030   pH 8.5 (*) 5.0 - 8.0   Glucose, UA NEGATIVE  NEGATIVE mg/dL   Hgb urine dipstick NEGATIVE  NEGATIVE   Bilirubin Urine NEGATIVE  NEGATIVE   Ketones, ur NEGATIVE  NEGATIVE mg/dL   Protein, ur NEGATIVE  NEGATIVE mg/dL   Urobilinogen, UA 0.2  0.0 - 1.0 mg/dL   Nitrite NEGATIVE  NEGATIVE   Leukocytes, UA NEGATIVE  NEGATIVE   Comment: MICROSCOPIC NOT DONE ON URINES WITH NEGATIVE PROTEIN, BLOOD, LEUKOCYTES, NITRITE, OR GLUCOSE <1000 mg/dL.  CBC     Status: Abnormal   Collection Time    05/27/13  3:12 PM      Result Value Ref Range   WBC 10.1  4.0 - 10.5 K/uL   RBC 4.01  3.87 - 5.11 MIL/uL   Hemoglobin 11.6 (*) 12.0 - 15.0 g/dL   HCT 91.433.9 (*) 78.236.0 - 95.646.0 %   MCV 84.5  78.0 - 100.0 fL   MCH 28.9  26.0 - 34.0 pg   MCHC 34.2  30.0 - 36.0 g/dL   RDW 21.313.6  08.611.5 - 57.815.5 %   Platelets 219  150 - 400 K/uL  GLUCOSE, CAPILLARY     Status: Abnormal   Collection Time    05/27/13  3:18 PM      Result Value Ref Range   Glucose-Capillary 66 (*) 70 - 99 mg/dL    Review of Systems  Constitutional: Negative for fever and chills.  Eyes: Negative for blurred vision.  Respiratory: Positive for shortness of breath.   Cardiovascular: Negative for chest pain.  Gastrointestinal: Negative for nausea, vomiting, abdominal pain, diarrhea and constipation.  Neurological: Positive for dizziness.  Psychiatric/Behavioral: Negative for depression and suicidal ideas. The patient is not nervous/anxious.    Physical Exam   Blood pressure 111/74, pulse 95, temperature 98.2 F (36.8 C), temperature source Oral, resp. rate 16, height 5\' 7"  (1.702 m), weight  64.864 kg (143 lb), SpO2 100.00%.  Physical Exam  Constitutional: Vital signs are normal. She appears well-developed and well-nourished. She does not have a sickly appearance. She does not appear ill. No distress.  Eyes: EOM are normal. Pupils are equal, round, and reactive to light.  Neck: Normal range of motion.  Cardiovascular: Normal rate, regular rhythm and normal heart sounds.   Respiratory: Effort normal and breath sounds normal. No respiratory distress. She has no wheezes. She has no rales. She exhibits no tenderness.  Neurological: She is alert.  Skin: She is not diaphoretic.  Psychiatric: She exhibits a depressed mood.    MAU Course  Procedures None  MDM +fht  CBG EKG CBC Normal sinus rhythm; normal EKG Patient tolerate po fluids and crackers.    Assessment and Plan   A:  1. Spell of dizziness   2. Shortness of breath due to pregnancy     P:  Discharge home in stable condition Discussed the importance of drinking 8-10 glasses of water per day- everyday Discussed the importance of eating small, frequent meals throughout the day I encouraged the patient to discuss this at her next prenatal appointment Return to MAU if symptoms worsen  Iona HansenJennifer Irene Ernestina Joe, NP  05/27/2013, 4:19 PM

## 2013-05-27 NOTE — MAU Note (Signed)
Patient state that she has been getting short of breath with any exertion today and has happened once before. States she has low blood sugar. Feels dizzy a lot during the pregnancy. Denies bleeding or discharge. States she has nausea with dry heaves today. Unable to eat today.

## 2013-06-02 LAB — OB RESULTS CONSOLE HIV ANTIBODY (ROUTINE TESTING): HIV: NONREACTIVE

## 2013-06-02 LAB — OB RESULTS CONSOLE ABO/RH: RH Type: POSITIVE

## 2013-06-02 LAB — OB RESULTS CONSOLE ANTIBODY SCREEN: Antibody Screen: NEGATIVE

## 2013-06-02 LAB — OB RESULTS CONSOLE HEPATITIS B SURFACE ANTIGEN: Hepatitis B Surface Ag: NEGATIVE

## 2013-06-02 LAB — OB RESULTS CONSOLE RUBELLA ANTIBODY, IGM: Rubella: IMMUNE

## 2013-06-02 LAB — OB RESULTS CONSOLE RPR: RPR: NONREACTIVE

## 2013-08-29 ENCOUNTER — Encounter (HOSPITAL_COMMUNITY): Payer: Self-pay

## 2013-08-29 ENCOUNTER — Inpatient Hospital Stay (HOSPITAL_COMMUNITY)
Admission: AD | Admit: 2013-08-29 | Discharge: 2013-08-29 | Disposition: A | Discharge status: Home / Self Care | Payer: Federal, State, Local not specified - PPO | Source: Ambulatory Visit | Attending: Obstetrics and Gynecology | Admitting: Obstetrics and Gynecology

## 2013-08-29 DIAGNOSIS — O9989 Other specified diseases and conditions complicating pregnancy, childbirth and the puerperium: Secondary | ICD-10-CM

## 2013-08-29 DIAGNOSIS — O368190 Decreased fetal movements, unspecified trimester, not applicable or unspecified: Secondary | ICD-10-CM

## 2013-08-29 DIAGNOSIS — Z87891 Personal history of nicotine dependence: Secondary | ICD-10-CM | POA: Insufficient documentation

## 2013-08-29 DIAGNOSIS — R197 Diarrhea, unspecified: Secondary | ICD-10-CM

## 2013-08-29 DIAGNOSIS — O99891 Other specified diseases and conditions complicating pregnancy: Secondary | ICD-10-CM | POA: Insufficient documentation

## 2013-08-29 LAB — URINALYSIS, ROUTINE W REFLEX MICROSCOPIC
Bilirubin Urine: NEGATIVE
Glucose, UA: NEGATIVE mg/dL
Hgb urine dipstick: NEGATIVE
Ketones, ur: NEGATIVE mg/dL
Leukocytes, UA: NEGATIVE
Nitrite: NEGATIVE
Protein, ur: NEGATIVE mg/dL
Specific Gravity, Urine: 1.015 (ref 1.005–1.030)
Urobilinogen, UA: 0.2 mg/dL (ref 0.0–1.0)
pH: 6 (ref 5.0–8.0)

## 2013-08-29 MED ORDER — 4X PROBIOTIC PO TABS: 1.0000 | ORAL_TABLET | ORAL | Status: DC

## 2013-08-29 NOTE — MAU Provider Note (Signed)
History     CSN: [id number]  Arrival date and time: 08/29/13 2105   None     Chief Complaint  Patient presents with  . Nausea  . Diarrhea  . Decreased Fetal Movement   HPI This is a 23 y.o. female at 6062w4d who presents with c/o diarrhea since this morning. Denies fever or vomiting but did have some nausea. No sick contacts. Able to keep down fluids. Decreased FM. Feels FM now.  RN Note:  Has had diarrhea 5times today. Some nausea. Decreased FM today. Some mucousy d/c. (works [employer] and was in Triage last night but does not think anyone came in with diarrhea) Has felt FM since coming to Triage.       OB History   Grav Para Term Preterm Abortions TAB SAB Ect Mult Living   1               Past Medical History  Diagnosis Date  . Hypoglycemia   . Chlamydia   . Anxiety   . Depression   . Hypoglycemia     pt just has a lot of protein snacks to manage this    Past Surgical History  Procedure Laterality Date  . No past surgeries      Family History  Problem Relation Age of Onset  . Other Neg Hx   . Hypertension Mother     History  Substance Use Topics  . Smoking status: Former Games developermoker  . Smokeless tobacco: Never Used     Comment: briefly at age 23  . Alcohol Use: No     Comment: occ    Allergies:  Allergies  Allergen Reactions  . Food Hives and Itching    Ceasar Dressing    Prescriptions prior to admission  Medication Sig Dispense Refill  . Prenatal Vit-Fe Fumarate-FA (PRENATAL MULTIVITAMIN) TABS tablet Take 1 tablet by mouth daily at 12 noon.        Review of Systems  Constitutional: Negative for fever, chills and malaise/fatigue.  Gastrointestinal: Positive for nausea and diarrhea. Negative for vomiting, abdominal pain and constipation.  Genitourinary: Negative for dysuria.  Musculoskeletal: Negative for myalgias.  Neurological: Negative for dizziness and headaches.   Physical Exam   Blood pressure 117/70, pulse 84, temperature 98.8 F (37.1  C), resp. rate 20, height 5\' 7"  (1.702 m), weight 73.573 kg (162 lb 3.2 oz).  Physical Exam  Constitutional: She is oriented to person, place, and time. She appears well-developed and well-nourished. No distress.  HENT:  Head: Normocephalic.  Cardiovascular: Normal rate.   Respiratory: Effort normal.  GI: Soft. She exhibits no distension. There is no tenderness. There is no rebound and no guarding.  Genitourinary: Vagina normal and uterus normal. No vaginal discharge found.  Cervix long and closed FHR reactive No contractions, Does have intermittent irritability  Musculoskeletal: Normal range of motion.  Neurological: She is alert and oriented to person, place, and time.  Skin: Skin is warm and dry.  Psychiatric: She has a normal mood and affect.    MAU Course  Procedures  MDM Discussed with Dr Rana SnareLowe  Assessment and Plan  A:  SIUP at 6162w4d       Diarrhea x 1 day      Decreased fetal movement, feels it now       No labor  P;  Discussed conservative care and hydration       Avoid juices       Start probiotics       Call  MD if worsens, or develops other symptoms.        Come back if worsens, decreased FM, PTL, vomiting, fever or abdominal pain  [facility] 08/29/2013, 9:47 PM

## 2013-08-29 NOTE — MAU Note (Addendum)
Has had diarrhea 5times today. Some nausea. Decreased FM today. Some mucousy d/c. (works [employer] and was in Triage last night but does not think anyone came in with diarrhea) Has felt FM since coming to Triage.

## 2013-08-29 NOTE — Discharge Instructions (Signed)

## 2013-09-26 ENCOUNTER — Encounter (HOSPITAL_COMMUNITY): Payer: Self-pay | Admitting: *Deleted

## 2013-09-26 ENCOUNTER — Other Ambulatory Visit (HOSPITAL_COMMUNITY): Payer: Self-pay | Admitting: Advanced Practice Midwife

## 2013-09-26 ENCOUNTER — Inpatient Hospital Stay (HOSPITAL_COMMUNITY)
Admission: AD | Admit: 2013-09-26 | Discharge: 2013-09-26 | Disposition: A | Discharge status: Home / Self Care | Payer: Federal, State, Local not specified - PPO | Source: Ambulatory Visit | Attending: Obstetrics & Gynecology | Admitting: Obstetrics & Gynecology

## 2013-09-26 DIAGNOSIS — Z87891 Personal history of nicotine dependence: Secondary | ICD-10-CM | POA: Diagnosis not present

## 2013-09-26 DIAGNOSIS — R112 Nausea with vomiting, unspecified: Secondary | ICD-10-CM | POA: Diagnosis not present

## 2013-09-26 DIAGNOSIS — O212 Late vomiting of pregnancy: Secondary | ICD-10-CM | POA: Diagnosis not present

## 2013-09-26 DIAGNOSIS — R109 Unspecified abdominal pain: Secondary | ICD-10-CM | POA: Insufficient documentation

## 2013-09-26 LAB — CBC WITH DIFFERENTIAL/PLATELET
Basophils Absolute: 0 10*3/uL (ref 0.0–0.1)
Basophils Relative: 0 % (ref 0–1)
Eosinophils Absolute: 0.1 10*3/uL (ref 0.0–0.7)
Eosinophils Relative: 1 % (ref 0–5)
HCT: 32.8 % — ABNORMAL LOW (ref 36.0–46.0)
Hemoglobin: 11.5 g/dL — ABNORMAL LOW (ref 12.0–15.0)
Lymphocytes Relative: 23 % (ref 12–46)
Lymphs Abs: 2.9 10*3/uL (ref 0.7–4.0)
MCH: 29.6 pg (ref 26.0–34.0)
MCHC: 35.1 g/dL (ref 30.0–36.0)
MCV: 84.3 fL (ref 78.0–100.0)
Monocytes Absolute: 1.2 10*3/uL — ABNORMAL HIGH (ref 0.1–1.0)
Monocytes Relative: 10 % (ref 3–12)
Neutro Abs: 8.4 10*3/uL — ABNORMAL HIGH (ref 1.7–7.7)
Neutrophils Relative %: 66 % (ref 43–77)
Platelets: 180 10*3/uL (ref 150–400)
RBC: 3.89 MIL/uL (ref 3.87–5.11)
RDW: 12.9 % (ref 11.5–15.5)
WBC: 12.6 10*3/uL — ABNORMAL HIGH (ref 4.0–10.5)

## 2013-09-26 LAB — COMPREHENSIVE METABOLIC PANEL
ALT: 16 U/L (ref 0–35)
AST: 19 U/L (ref 0–37)
Albumin: 3.1 g/dL — ABNORMAL LOW (ref 3.5–5.2)
Alkaline Phosphatase: 99 U/L (ref 39–117)
Anion gap: 14 (ref 5–15)
BUN: 9 mg/dL (ref 6–23)
CO2: 21 mEq/L (ref 19–32)
Calcium: 8.9 mg/dL (ref 8.4–10.5)
Chloride: 101 mEq/L (ref 96–112)
Creatinine, Ser: 0.49 mg/dL — ABNORMAL LOW (ref 0.50–1.10)
GFR calc Af Amer: >90 mL/min (ref >90)
GFR calc non Af Amer: >90 mL/min (ref >90)
Glucose, Bld: 82 mg/dL (ref 70–99)
Potassium: 3.9 mEq/L (ref 3.7–5.3)
Sodium: 136 mEq/L — ABNORMAL LOW (ref 137–147)
Total Bilirubin: <0.2 mg/dL — ABNORMAL LOW (ref 0.3–1.2)
Total Protein: 6.2 g/dL (ref 6.0–8.3)

## 2013-09-26 LAB — URINALYSIS, ROUTINE W REFLEX MICROSCOPIC
Bilirubin Urine: NEGATIVE
Glucose, UA: NEGATIVE mg/dL
Hgb urine dipstick: NEGATIVE
Ketones, ur: NEGATIVE mg/dL
Nitrite: NEGATIVE
Protein, ur: NEGATIVE mg/dL
Specific Gravity, Urine: 1.02 (ref 1.005–1.030)
Urobilinogen, UA: 0.2 mg/dL (ref 0.0–1.0)
pH: 6.5 (ref 5.0–8.0)

## 2013-09-26 LAB — URINE MICROSCOPIC-ADD ON

## 2013-09-26 LAB — AMYLASE: Amylase: 68 U/L (ref 0–105)

## 2013-09-26 LAB — LIPASE, BLOOD: Lipase: 28 U/L (ref 11–59)

## 2013-09-26 MED ORDER — PANTOPRAZOLE SODIUM 20 MG PO TBEC: 20.0000 mg | DELAYED_RELEASE_TABLET | Freq: Every day | ORAL | Status: DC

## 2013-09-26 MED ORDER — ONDANSETRON 4 MG PO TBDP: 4.0000 mg | ORAL_TABLET | Freq: Four times a day (QID) | ORAL | Status: DC | PRN

## 2013-09-26 MED ORDER — ONDANSETRON 8 MG PO TBDP
8.0000 mg | ORAL_TABLET | Freq: Once | ORAL | Status: AC
  Administered 2013-09-26: 8 mg via ORAL
  Filled 2013-09-26: qty 1

## 2013-09-26 NOTE — Discharge Instructions (Signed)

## 2013-09-26 NOTE — MAU Note (Signed)
Patient states while at work she started to feel upper abdominal cramping. Thought maybe it was indigestion. Has had two bowel movement and vomiting. Unsure if what she is feeling is contractions. Denies vaginal bleeding and leaking of fluid

## 2013-09-26 NOTE — MAU Provider Note (Signed)
History     CSN: [id number]  Arrival date and time: 09/26/13 7902   None     Chief Complaint  Patient presents with  . Emesis  . Abdominal Cramping   HPI This is a 23 y.o. female at 3w4dwho presents with sudden onset of upper abdominal cramping and vomiting. Denies leaking or bleeding. States it started suddenly and still hurts upper abdomen. Has had some cramping for a week or so, Doctors told her it was OK.   Denies fever.   RN Note: Patient states while at work she started to feel upper abdominal cramping. Thought maybe it was indigestion. Has had two bowel movement and vomiting. Unsure if what she is feeling is contractions. Denies vaginal bleeding and leaking of fluid       OB History   Grav Para Term Preterm Abortions TAB SAB Ect Mult Living   1               Past Medical History  Diagnosis Date  . Hypoglycemia   . Chlamydia   . Anxiety   . Depression   . Hypoglycemia     pt just has a lot of protein snacks to manage this    Past Surgical History  Procedure Laterality Date  . No past surgeries      Family History  Problem Relation Age of Onset  . Other Neg Hx   . Hypertension Mother     History  Substance Use Topics  . Smoking status: Former SResearch scientist (life sciences) . Smokeless tobacco: Never Used     Comment: briefly at age 23 . Alcohol Use: No     Comment: occ    Allergies:  Allergies  Allergen Reactions  . Food Hives and Itching    Ceasar Dressing    Prescriptions prior to admission  Medication Sig Dispense Refill  . Prenatal Vit-Fe Fumarate-FA (PRENATAL MULTIVITAMIN) TABS tablet Take 1 tablet by mouth daily at 12 noon.      . Probiotic Product (4X PROBIOTIC) TABS Take 1 capsule by mouth 1 day or 1 dose.  30 tablet  1    Review of Systems  Constitutional: Negative for fever and chills.  Gastrointestinal: Positive for nausea, vomiting and abdominal pain. Negative for diarrhea and constipation.  Neurological: Negative for dizziness.   Physical  Exam   Blood pressure 125/77, pulse 87, temperature 98.6 F (37 C), temperature source Oral, resp. rate 20, height '5\' 7"'  (1.702 m), weight 77.111 kg (170 lb).  Physical Exam  Constitutional: She is oriented to person, place, and time. She appears well-developed and well-nourished. No distress.  HENT:  Head: Normocephalic.  Cardiovascular: Normal rate, regular rhythm and normal heart sounds.  Exam reveals no gallop and no friction rub.   No murmur heard. Respiratory: Effort normal and breath sounds normal. No respiratory distress. She has no wheezes. She has no rales. She exhibits no tenderness.  GI: Soft. She exhibits no distension and no mass. There is tenderness (Significant tenderness right mid and lower abdomen). There is guarding. There is no rebound.  Genitourinary: Vagina normal and uterus normal. No vaginal discharge found.  Dilation: Closed Effacement (%): 30 Cervical Position: Posterior Station: -3 Presentation: Vertex Exam by:: MCarmelia RollerCNM   Musculoskeletal: Normal range of motion.  Neurological: She is alert and oriented to person, place, and time.  Skin: Skin is warm and dry.  Psychiatric: She has a normal mood and affect.   FHR reactive Occasional contractions  MAU Course  Procedures  MDM Will check CBC and chemistries  Results for orders placed during the hospital encounter of 09/26/13 (from the past 72 hour(s))  URINALYSIS, ROUTINE W REFLEX MICROSCOPIC     Status: Abnormal   Collection Time    09/26/13  2:38 AM      Result Value Ref Range   Color, Urine YELLOW  YELLOW   APPearance CLEAR  CLEAR   Specific Gravity, Urine 1.020  1.005 - 1.030   pH 6.5  5.0 - 8.0   Glucose, UA NEGATIVE  NEGATIVE mg/dL   Hgb urine dipstick NEGATIVE  NEGATIVE   Bilirubin Urine NEGATIVE  NEGATIVE   Ketones, ur NEGATIVE  NEGATIVE mg/dL   Protein, ur NEGATIVE  NEGATIVE mg/dL   Urobilinogen, UA 0.2  0.0 - 1.0 mg/dL   Nitrite NEGATIVE  NEGATIVE   Leukocytes, UA SMALL (*)  NEGATIVE  URINE MICROSCOPIC-ADD ON     Status: Abnormal   Collection Time    09/26/13  2:38 AM      Result Value Ref Range   Squamous Epithelial / LPF FEW (*) RARE   WBC, UA 11-20  <3 WBC/hpf   Bacteria, UA FEW (*) RARE   Urine-Other MUCOUS PRESENT    CBC WITH DIFFERENTIAL     Status: Abnormal   Collection Time    09/26/13  3:22 AM      Result Value Ref Range   WBC 12.6 (*) 4.0 - 10.5 K/uL   RBC 3.89  3.87 - 5.11 MIL/uL   Hemoglobin 11.5 (*) 12.0 - 15.0 g/dL   HCT 32.8 (*) 36.0 - 46.0 %   MCV 84.3  78.0 - 100.0 fL   MCH 29.6  26.0 - 34.0 pg   MCHC 35.1  30.0 - 36.0 g/dL   RDW 12.9  11.5 - 15.5 %   Platelets 180  150 - 400 K/uL   Neutrophils Relative % 66  43 - 77 %   Neutro Abs 8.4 (*) 1.7 - 7.7 K/uL   Lymphocytes Relative 23  12 - 46 %   Lymphs Abs 2.9  0.7 - 4.0 K/uL   Monocytes Relative 10  3 - 12 %   Monocytes Absolute 1.2 (*) 0.1 - 1.0 K/uL   Eosinophils Relative 1  0 - 5 %   Eosinophils Absolute 0.1  0.0 - 0.7 K/uL   Basophils Relative 0  0 - 1 %   Basophils Absolute 0.0  0.0 - 0.1 K/uL  COMPREHENSIVE METABOLIC PANEL     Status: Abnormal   Collection Time    09/26/13  3:22 AM      Result Value Ref Range   Sodium 136 (*) 137 - 147 mEq/L   Potassium 3.9  3.7 - 5.3 mEq/L   Chloride 101  96 - 112 mEq/L   CO2 21  19 - 32 mEq/L   Glucose, Bld 82  70 - 99 mg/dL   BUN 9  6 - 23 mg/dL   Creatinine, Ser 0.49 (*) 0.50 - 1.10 mg/dL   Calcium 8.9  8.4 - 10.5 mg/dL   Total Protein 6.2  6.0 - 8.3 g/dL   Albumin 3.1 (*) 3.5 - 5.2 g/dL   AST 19  0 - 37 U/L   ALT 16  0 - 35 U/L   Alkaline Phosphatase 99  39 - 117 U/L   Total Bilirubin <0.2 (*) 0.3 - 1.2 mg/dL   GFR calc non Af Amer >90  >90 mL/min   GFR calc Af  Amer >90  >90 mL/min   Comment: (NOTE)     The eGFR has been calculated using the CKD EPI equation.     This calculation has not been validated in all clinical situations.     eGFR's persistently <90 mL/min signify possible Chronic Kidney     Disease.   Anion gap  14  5 - 15    Assessment and Plan  A:  SIUP at [redacted]w[redacted]d       Vomiting x 2 with nausea       Reassuring FHR tracing        P:  Discussed with Dr MLynnette Caffey      Conservative care       Rx Zofran and Protonix provided       Instructed to call office today if feeling worse or if anything changes   W[facility]8/21/2015, 2:56 AM

## 2013-10-02 LAB — OB RESULTS CONSOLE GC/CHLAMYDIA
Chlamydia: NEGATIVE
Gonorrhea: NEGATIVE

## 2013-10-02 LAB — OB RESULTS CONSOLE GBS: GBS: NEGATIVE

## 2013-11-02 ENCOUNTER — Encounter (HOSPITAL_COMMUNITY): Payer: Self-pay | Admitting: *Deleted

## 2013-11-02 ENCOUNTER — Observation Stay (HOSPITAL_COMMUNITY)
Admission: AD | Admit: 2013-11-02 | Discharge: 2013-11-02 | Disposition: A | Discharge status: Home / Self Care | Payer: Federal, State, Local not specified - PPO | Source: Ambulatory Visit | Attending: Obstetrics and Gynecology | Admitting: Obstetrics and Gynecology

## 2013-11-02 DIAGNOSIS — F3289 Other specified depressive episodes: Secondary | ICD-10-CM | POA: Diagnosis not present

## 2013-11-02 DIAGNOSIS — Z87891 Personal history of nicotine dependence: Secondary | ICD-10-CM | POA: Diagnosis not present

## 2013-11-02 DIAGNOSIS — F329 Major depressive disorder, single episode, unspecified: Secondary | ICD-10-CM | POA: Diagnosis not present

## 2013-11-02 DIAGNOSIS — F411 Generalized anxiety disorder: Secondary | ICD-10-CM | POA: Diagnosis not present

## 2013-11-02 DIAGNOSIS — O479 False labor, unspecified: Principal | ICD-10-CM | POA: Insufficient documentation

## 2013-11-02 LAB — CBC
HCT: 36 % (ref 36.0–46.0)
Hemoglobin: 12.2 g/dL (ref 12.0–15.0)
MCH: 28.6 pg (ref 26.0–34.0)
MCHC: 33.9 g/dL (ref 30.0–36.0)
MCV: 84.3 fL (ref 78.0–100.0)
Platelets: 199 10*3/uL (ref 150–400)
RBC: 4.27 MIL/uL (ref 3.87–5.11)
RDW: 13.6 % (ref 11.5–15.5)
WBC: 11.2 10*3/uL — ABNORMAL HIGH (ref 4.0–10.5)

## 2013-11-02 LAB — RPR

## 2013-11-02 MED ORDER — ACETAMINOPHEN 325 MG PO TABS: 650.0000 mg | ORAL_TABLET | ORAL | Status: DC | PRN

## 2013-11-02 MED ORDER — CITRIC ACID-SODIUM CITRATE 334-500 MG/5ML PO SOLN: 30.0000 mL | ORAL | Status: DC | PRN

## 2013-11-02 MED ORDER — BUTORPHANOL TARTRATE 1 MG/ML IJ SOLN
1.0000 mg | INTRAMUSCULAR | Status: DC
  Administered 2013-11-02: 1 mg via INTRAVENOUS

## 2013-11-02 MED ORDER — FLEET ENEMA 7-19 GM/118ML RE ENEM: 1.0000 | ENEMA | RECTAL | Status: DC | PRN

## 2013-11-02 MED ORDER — LACTATED RINGERS IV SOLN
INTRAVENOUS | Status: DC
  Administered 2013-11-02: 12:00:00 via INTRAVENOUS

## 2013-11-02 MED ORDER — LACTATED RINGERS IV SOLN: 500.0000 mL | INTRAVENOUS | Status: DC | PRN

## 2013-11-02 MED ORDER — EPHEDRINE 5 MG/ML INJ: 10.0000 mg | INTRAVENOUS | Status: DC | PRN

## 2013-11-02 MED ORDER — ONDANSETRON HCL 4 MG/2ML IJ SOLN: 4.0000 mg | Freq: Four times a day (QID) | INTRAMUSCULAR | Status: DC | PRN

## 2013-11-02 MED ORDER — OXYCODONE-ACETAMINOPHEN 5-325 MG PO TABS: 2.0000 | ORAL_TABLET | ORAL | Status: DC | PRN

## 2013-11-02 MED ORDER — PROMETHAZINE HCL 25 MG/ML IJ SOLN
12.5000 mg | Freq: Four times a day (QID) | INTRAMUSCULAR | Status: DC | PRN
  Administered 2013-11-02: 12.5 mg via INTRAVENOUS
  Filled 2013-11-02: qty 1

## 2013-11-02 MED ORDER — PHENYLEPHRINE 40 MCG/ML (10ML) SYRINGE FOR IV PUSH (FOR BLOOD PRESSURE SUPPORT): 80.0000 ug | PREFILLED_SYRINGE | INTRAVENOUS | Status: DC | PRN

## 2013-11-02 MED ORDER — BUTORPHANOL TARTRATE 1 MG/ML IJ SOLN
INTRAMUSCULAR | Status: AC
  Administered 2013-11-02: 1 mg via INTRAVENOUS
  Filled 2013-11-02: qty 1

## 2013-11-02 MED ORDER — OXYCODONE-ACETAMINOPHEN 5-325 MG PO TABS: 1.0000 | ORAL_TABLET | ORAL | Status: DC | PRN

## 2013-11-02 MED ORDER — LACTATED RINGERS IV SOLN: 500.0000 mL | Freq: Once | INTRAVENOUS | Status: DC

## 2013-11-02 MED ORDER — DIPHENHYDRAMINE HCL 50 MG/ML IJ SOLN: 12.5000 mg | INTRAMUSCULAR | Status: DC | PRN

## 2013-11-02 MED ORDER — OXYTOCIN BOLUS FROM INFUSION: 500.0000 mL | INTRAVENOUS | Status: DC

## 2013-11-02 MED ORDER — BUTORPHANOL TARTRATE 1 MG/ML IJ SOLN: 1.0000 mg | INTRAMUSCULAR | Status: DC | PRN

## 2013-11-02 MED ORDER — FENTANYL 2.5 MCG/ML BUPIVACAINE 1/10 % EPIDURAL INFUSION (WH - ANES): 14.0000 mL/h | INTRAMUSCULAR | Status: DC | PRN

## 2013-11-02 MED ORDER — LIDOCAINE HCL (PF) 1 % IJ SOLN: 30.0000 mL | INTRAMUSCULAR | Status: DC | PRN

## 2013-11-02 MED ORDER — OXYTOCIN 40 UNITS IN LACTATED RINGERS INFUSION - SIMPLE MED: 62.5000 mL/h | INTRAVENOUS | Status: DC

## 2013-11-02 NOTE — H&P (Signed)
Veronica Beasley is a 23 y.o. female presenting AT 39.6 with sol.  Neg GBS. Maternal Medical History:  Reason for admission: Contractions.   Contractions: Onset was 3-5 hours ago.   Frequency: regular.   Perceived severity is moderate.    Fetal activity: Perceived fetal activity is normal.    Prenatal complications: no prenatal complications Prenatal Complications - Diabetes: none.    OB History   Grav Para Term Preterm Abortions TAB SAB Ect Mult Living   1              Past Medical History  Diagnosis Date  . Hypoglycemia   . Chlamydia   . Anxiety   . Depression   . Hypoglycemia     pt just has a lot of protein snacks to manage this   Past Surgical History  Procedure Laterality Date  . No past surgeries     Family History: family history includes Hypertension in her mother. There is no history of Other. Social History:  reports that she has quit smoking. She has never used smokeless tobacco. She reports that she does not drink alcohol or use illicit drugs.   Prenatal Transfer Tool  Maternal Diabetes: No Genetic Screening: Normal Maternal Ultrasounds/Referrals: Normal Fetal Ultrasounds or other Referrals:  None Maternal Substance Abuse:  No Significant Maternal Medications:  None Significant Maternal Lab Results:  None Other Comments:  None  ROS  Dilation: 4 Effacement (%): 80 Station: -3;-2 Exam by:: A. Gagliardo, RN Blood pressure 116/87, pulse 94, temperature 97.8 F (36.6 C), temperature source Oral, resp. rate 18. Maternal Exam:  Uterine Assessment: Contraction strength is moderate.  Contraction frequency is regular.   Abdomen: Patient reports no abdominal tenderness. Fundal height is C/W DATES.   Estimated fetal weight is 7.   Fetal presentation: vertex  Pelvis: adequate for delivery.   Cervix: 4 CM  Fetal Exam Fetal State Assessment: Category I - tracings are normal.     Physical Exam  Prenatal labs: ABO, Rh:   Antibody:   Rubella:   RPR:     HBsAg:    HIV:    GBS:     Assessment/Plan: IUP at 39.6 with SOL Routine labor and delivery   Faviola Klare S 11/02/2013, 11:05 AM

## 2013-11-02 NOTE — Discharge Instructions (Signed)
Keep scheduled MD office appointment Mon 11/03/2013

## 2013-11-02 NOTE — MAU Note (Signed)
Contractions started around 0430 this morning and have gotten worse since.  Denies LOF/VB.

## 2013-11-03 ENCOUNTER — Inpatient Hospital Stay (HOSPITAL_COMMUNITY)
Admission: AD | Admit: 2013-11-03 | Discharge: 2013-11-04 | DRG: 775 | Disposition: A | Discharge status: Home / Self Care | Payer: Federal, State, Local not specified - PPO | Source: Ambulatory Visit | Attending: Obstetrics & Gynecology | Admitting: Obstetrics & Gynecology

## 2013-11-03 ENCOUNTER — Encounter (HOSPITAL_COMMUNITY): Payer: Self-pay

## 2013-11-03 ENCOUNTER — Inpatient Hospital Stay (HOSPITAL_COMMUNITY): Payer: Federal, State, Local not specified - PPO | Admitting: Anesthesiology

## 2013-11-03 ENCOUNTER — Encounter (HOSPITAL_COMMUNITY): Payer: Federal, State, Local not specified - PPO | Admitting: Anesthesiology

## 2013-11-03 DIAGNOSIS — O479 False labor, unspecified: Secondary | ICD-10-CM | POA: Diagnosis present

## 2013-11-03 DIAGNOSIS — Z3490 Encounter for supervision of normal pregnancy, unspecified, unspecified trimester: Secondary | ICD-10-CM

## 2013-11-03 LAB — CBC
HCT: 34.8 % — ABNORMAL LOW (ref 36.0–46.0)
Hemoglobin: 11.7 g/dL — ABNORMAL LOW (ref 12.0–15.0)
MCH: 28.5 pg (ref 26.0–34.0)
MCHC: 33.6 g/dL (ref 30.0–36.0)
MCV: 84.9 fL (ref 78.0–100.0)
Platelets: 199 10*3/uL (ref 150–400)
RBC: 4.1 MIL/uL (ref 3.87–5.11)
RDW: 13.7 % (ref 11.5–15.5)
WBC: 11.5 10*3/uL — ABNORMAL HIGH (ref 4.0–10.5)

## 2013-11-03 LAB — RPR

## 2013-11-03 MED ORDER — OXYCODONE-ACETAMINOPHEN 5-325 MG PO TABS: 2.0000 | ORAL_TABLET | ORAL | Status: DC | PRN

## 2013-11-03 MED ORDER — ONDANSETRON HCL 4 MG/2ML IJ SOLN: 4.0000 mg | Freq: Four times a day (QID) | INTRAMUSCULAR | Status: DC | PRN

## 2013-11-03 MED ORDER — SIMETHICONE 80 MG PO CHEW: 80.0000 mg | CHEWABLE_TABLET | ORAL | Status: DC | PRN

## 2013-11-03 MED ORDER — INFLUENZA VAC SPLIT QUAD 0.5 ML IM SUSY
0.5000 mL | PREFILLED_SYRINGE | INTRAMUSCULAR | Status: AC
  Administered 2013-11-04: 0.5 mL via INTRAMUSCULAR
  Filled 2013-11-03: qty 0.5

## 2013-11-03 MED ORDER — PHENYLEPHRINE 40 MCG/ML (10ML) SYRINGE FOR IV PUSH (FOR BLOOD PRESSURE SUPPORT)
80.0000 ug | PREFILLED_SYRINGE | INTRAVENOUS | Status: DC | PRN
  Filled 2013-11-03: qty 2

## 2013-11-03 MED ORDER — DIPHENHYDRAMINE HCL 25 MG PO CAPS: 25.0000 mg | ORAL_CAPSULE | Freq: Four times a day (QID) | ORAL | Status: DC | PRN

## 2013-11-03 MED ORDER — FLEET ENEMA 7-19 GM/118ML RE ENEM: 1.0000 | ENEMA | RECTAL | Status: DC | PRN

## 2013-11-03 MED ORDER — EPHEDRINE 5 MG/ML INJ
10.0000 mg | INTRAVENOUS | Status: DC | PRN
  Filled 2013-11-03: qty 2

## 2013-11-03 MED ORDER — PHENYLEPHRINE 40 MCG/ML (10ML) SYRINGE FOR IV PUSH (FOR BLOOD PRESSURE SUPPORT)
PREFILLED_SYRINGE | INTRAVENOUS | Status: AC
  Filled 2013-11-03: qty 10

## 2013-11-03 MED ORDER — SENNOSIDES-DOCUSATE SODIUM 8.6-50 MG PO TABS
2.0000 | ORAL_TABLET | ORAL | Status: DC
  Administered 2013-11-03: 2 via ORAL
  Filled 2013-11-03: qty 2

## 2013-11-03 MED ORDER — WITCH HAZEL-GLYCERIN EX PADS: 1.0000 "application " | MEDICATED_PAD | CUTANEOUS | Status: DC | PRN

## 2013-11-03 MED ORDER — FENTANYL 2.5 MCG/ML BUPIVACAINE 1/10 % EPIDURAL INFUSION (WH - ANES): 14.0000 mL/h | INTRAMUSCULAR | Status: DC | PRN

## 2013-11-03 MED ORDER — ONDANSETRON HCL 4 MG PO TABS: 4.0000 mg | ORAL_TABLET | ORAL | Status: DC | PRN

## 2013-11-03 MED ORDER — FENTANYL 2.5 MCG/ML BUPIVACAINE 1/10 % EPIDURAL INFUSION (WH - ANES)
INTRAMUSCULAR | Status: AC
  Administered 2013-11-03: 03:00:00
  Filled 2013-11-03: qty 125

## 2013-11-03 MED ORDER — BENZOCAINE-MENTHOL 20-0.5 % EX AERO
1.0000 | INHALATION_SPRAY | CUTANEOUS | Status: DC | PRN
Start: 2013-11-03 — End: 2013-11-04
  Administered 2013-11-03: 1 via TOPICAL
  Filled 2013-11-03: qty 56

## 2013-11-03 MED ORDER — OXYCODONE-ACETAMINOPHEN 5-325 MG PO TABS
1.0000 | ORAL_TABLET | ORAL | Status: DC | PRN
Start: 2013-11-03 — End: 2013-11-04

## 2013-11-03 MED ORDER — CITRIC ACID-SODIUM CITRATE 334-500 MG/5ML PO SOLN: 30.0000 mL | ORAL | Status: DC | PRN

## 2013-11-03 MED ORDER — LACTATED RINGERS IV SOLN: 500.0000 mL | INTRAVENOUS | Status: DC | PRN

## 2013-11-03 MED ORDER — ACETAMINOPHEN 325 MG PO TABS: 650.0000 mg | ORAL_TABLET | ORAL | Status: DC | PRN

## 2013-11-03 MED ORDER — DIPHENHYDRAMINE HCL 50 MG/ML IJ SOLN: 12.5000 mg | INTRAMUSCULAR | Status: DC | PRN

## 2013-11-03 MED ORDER — FENTANYL 2.5 MCG/ML BUPIVACAINE 1/10 % EPIDURAL INFUSION (WH - ANES)
INTRAMUSCULAR | Status: DC | PRN
  Administered 2013-11-03: 14 mL/h via EPIDURAL

## 2013-11-03 MED ORDER — TETANUS-DIPHTH-ACELL PERTUSSIS 5-2.5-18.5 LF-MCG/0.5 IM SUSP: 0.5000 mL | Freq: Once | INTRAMUSCULAR | Status: DC

## 2013-11-03 MED ORDER — LANOLIN HYDROUS EX OINT: TOPICAL_OINTMENT | CUTANEOUS | Status: DC | PRN

## 2013-11-03 MED ORDER — DIBUCAINE 1 % RE OINT
1.0000 "application " | TOPICAL_OINTMENT | RECTAL | Status: DC | PRN
  Filled 2013-11-03: qty 28

## 2013-11-03 MED ORDER — LACTATED RINGERS IV SOLN
INTRAVENOUS | Status: DC
  Administered 2013-11-03: 125 mL/h via INTRAVENOUS

## 2013-11-03 MED ORDER — ONDANSETRON HCL 4 MG/2ML IJ SOLN: 4.0000 mg | INTRAMUSCULAR | Status: DC | PRN

## 2013-11-03 MED ORDER — IBUPROFEN 600 MG PO TABS
600.0000 mg | ORAL_TABLET | Freq: Four times a day (QID) | ORAL | Status: DC
  Administered 2013-11-03 – 2013-11-04 (×4): 600 mg via ORAL
  Filled 2013-11-03 (×5): qty 1

## 2013-11-03 MED ORDER — OXYCODONE-ACETAMINOPHEN 5-325 MG PO TABS: 1.0000 | ORAL_TABLET | ORAL | Status: DC | PRN

## 2013-11-03 MED ORDER — LIDOCAINE HCL (PF) 1 % IJ SOLN
30.0000 mL | INTRAMUSCULAR | Status: DC | PRN
  Filled 2013-11-03: qty 30

## 2013-11-03 MED ORDER — LIDOCAINE HCL (PF) 1 % IJ SOLN
INTRAMUSCULAR | Status: DC | PRN
  Administered 2013-11-03 (×2): 8 mL

## 2013-11-03 MED ORDER — OXYTOCIN 40 UNITS IN LACTATED RINGERS INFUSION - SIMPLE MED
62.5000 mL/h | INTRAVENOUS | Status: DC
  Administered 2013-11-03: 62.5 mL/h via INTRAVENOUS
  Filled 2013-11-03: qty 1000

## 2013-11-03 MED ORDER — ZOLPIDEM TARTRATE 5 MG PO TABS: 5.0000 mg | ORAL_TABLET | Freq: Every evening | ORAL | Status: DC | PRN

## 2013-11-03 MED ORDER — PRENATAL MULTIVITAMIN CH
1.0000 | ORAL_TABLET | Freq: Every day | ORAL | Status: DC
  Administered 2013-11-04: 1 via ORAL
  Filled 2013-11-03: qty 1

## 2013-11-03 MED ORDER — OXYTOCIN BOLUS FROM INFUSION
500.0000 mL | INTRAVENOUS | Status: DC
  Administered 2013-11-03: 500 mL via INTRAVENOUS

## 2013-11-03 MED ORDER — LACTATED RINGERS IV SOLN: 500.0000 mL | Freq: Once | INTRAVENOUS | Status: DC

## 2013-11-03 NOTE — H&P (Signed)
  Patient presents back with SOL.   See previous H and p.   Af VSS   GRAVID UTERUS    CERVIX 5 CM   FHR CAT ONE IMP: IUP AT 39+ WITH SOL PLAN:  ROUTINE LABOR AND DELIVERY

## 2013-11-03 NOTE — Discharge Summary (Signed)
  Admitting diagnosis: Intrauterine pregnancy at term with spontaneous onset of labor.  Discharge diagnoses: False labor  History and physical please see dictated note.  Course in the hospital: Patient came in on September 27 with possible spontaneous onset of labor. After feeling there was a cervical change in MAU she was transferred to labor and delivery. Over a period of observation and rechecking the cervix. It was determined that the patient was not into labor she was subsequently discharged home   She discharged him stable condition.  Patient should come back to MAU if contractions increase or if there is any leaking of fluid.

## 2013-11-03 NOTE — Anesthesia Postprocedure Evaluation (Signed)
  Anesthesia Post-op Note  Anesthesia Post Note  Patient: Veronica Beasley  Procedure(s) Performed: * No procedures listed *  Anesthesia type: Epidural  Patient location: Mother/Baby  Post pain: Pain level controlled  Post assessment: Post-op Vital signs reviewed  Last Vitals:  Filed Vitals:   11/03/13 1215  BP: 114/67  Pulse: 79  Temp: 37.2 C  Resp: 18    Post vital signs: Reviewed  Level of consciousness:alert  Complications: No apparent anesthesia complications

## 2013-11-03 NOTE — Progress Notes (Signed)
Veronica Beasley is a 23 y.o. G1P0 at [redacted]w[redacted]d by ultrasound admitted for active labor  Subjective:   Objective: BP 127/83  Pulse 99  Temp(Src) 98.7 F (37.1 C) (Oral)  Resp 18  Ht  (1.702 m)  Wt 170 lb (77.111 kg)  BMI 26.62 kg/m2  SpO2 98%      FHT:  FHR: 135 bpm, variability: moderate,  accelerations:  Present,  decelerations:  Absent UC:   regular, every 3 minutes SVE:   Dilation: 10 Effacement (%): 100 Station: +2 Exam by:: S Nix RN  Labs: Lab Results  Component Value Date   WBC 11.5* 11/03/2013   HGB 11.7* 11/03/2013   HCT 34.8* 11/03/2013   MCV 84.9 11/03/2013   PLT 199 11/03/2013    Assessment / Plan: Spontaneous labor, progressing normally  Labor: Progressing normally Preeclampsia:  n/a Fetal Wellbeing:  Category I Pain Control:  Epidural I/D:  n/a Anticipated MOD:  NSVD  Veronica Beasley 11/03/2013, 8:19 AM

## 2013-11-03 NOTE — Anesthesia Procedure Notes (Signed)
Epidural Patient location during procedure: OB Start time: 11/03/2013 2:55 AM End time: 11/03/2013 2:59 AM  Staffing Anesthesiologist: Leilani Able Performed by: anesthesiologist   Preanesthetic Checklist Completed: patient identified, surgical consent, pre-op evaluation, timeout performed, IV checked, risks and benefits discussed and monitors and equipment checked  Epidural Patient position: sitting Prep: site prepped and draped and DuraPrep Patient monitoring: continuous pulse ox and blood pressure Approach: midline Location: L3-L4 Injection technique: LOR air  Needle:  Needle type: Tuohy  Needle gauge: 17 G Needle length: 9 cm and 9 Needle insertion depth: 6 cm Catheter type: closed end flexible Catheter size: 19 Gauge Catheter at skin depth: 11 cm Test dose: negative and Other  Assessment Sensory level: T9 Events: blood not aspirated, injection not painful, no injection resistance, negative IV test and no paresthesia  Additional Notes Reason for block:procedure for pain

## 2013-11-03 NOTE — Anesthesia Preprocedure Evaluation (Signed)
Anesthesia Evaluation  Patient identified by MRN, date of birth, ID band Patient awake    Reviewed: Allergy & Precautions, H&P , NPO status , Patient's Chart, lab work & pertinent test results  Airway Mallampati: I  TM Distance: >3 FB Neck ROM: full    Dental no notable dental hx.    Pulmonary former smoker,    Pulmonary exam normal       Cardiovascular negative cardio ROS      Neuro/Psych negative neurological ROS     GI/Hepatic negative GI ROS, Neg liver ROS,   Endo/Other  negative endocrine ROS  Renal/GU negative Renal ROS     Musculoskeletal   Abdominal Normal abdominal exam  (+)   Peds  Hematology negative hematology ROS (+)   Anesthesia Other Findings   Reproductive/Obstetrics (+) Pregnancy                             Anesthesia Physical Anesthesia Plan  ASA: II  Anesthesia Plan: Epidural   Post-op Pain Management:    Induction:   Airway Management Planned:   Additional Equipment:   Intra-op Plan:   Post-operative Plan:   Informed Consent: I have reviewed the patients History and Physical, chart, labs and discussed the procedure including the risks, benefits and alternatives for the proposed anesthesia with the patient or authorized representative who has indicated his/her understanding and acceptance.     Plan Discussed with:   Anesthesia Plan Comments:         Anesthesia Quick Evaluation  

## 2013-11-03 NOTE — MAU Note (Signed)
Contractions every 5 mins. Denies LOF or vag bleeding. +FM. Was 3cm when in MAU earlier today.

## 2013-11-03 NOTE — Lactation Note (Signed)
This note was copied from the chart of Veronica Beasley. Lactation Consultation Note Initial visit at 7 hours of age.  Baby has had 4 feedings and latches well to left breast.  Mom feels baby doesn't latch well to right and she hasn't seen colostrum on right side.  Hand pump provided by RN to encouraged nipple everting and stimulation.  Hand expression attempted at this time without colostrum noted on right breast and good flow on left breast.  Baby just finished a feeding on left breast.  Positioning discussed and encouraged to alternate positions to help with latch.  [facility] resources given and discussed.  Encouraged to feed with early cues on demand.  Early newborn behavior discussed.  Mom to call for assist as needed.    Patient Name: Veronica Jenavie Stanczak [id number] Date: 11/03/2013 Reason for consult: Initial assessment   Maternal Data Has patient been taught Hand Expression?: Yes Does the patient have breastfeeding experience prior to this delivery?: No  Feeding    LATCH Score/Interventions                      Lactation Tools Discussed/Used Initiated by:: RN   Consult Status Consult Status: Follow-up Date: 11/04/13 Follow-up type: In-patient    Jannifer Rodney 11/03/2013, 4:27 PM

## 2013-11-04 LAB — CBC
HCT: 32.6 % — ABNORMAL LOW (ref 36.0–46.0)
Hemoglobin: 10.8 g/dL — ABNORMAL LOW (ref 12.0–15.0)
MCH: 28.1 pg (ref 26.0–34.0)
MCHC: 33.1 g/dL (ref 30.0–36.0)
MCV: 84.9 fL (ref 78.0–100.0)
Platelets: 192 10*3/uL (ref 150–400)
RBC: 3.84 MIL/uL — ABNORMAL LOW (ref 3.87–5.11)
RDW: 13.7 % (ref 11.5–15.5)
WBC: 15.5 10*3/uL — ABNORMAL HIGH (ref 4.0–10.5)

## 2013-11-04 MED ORDER — IBUPROFEN 600 MG PO TABS: 600.0000 mg | ORAL_TABLET | Freq: Four times a day (QID) | ORAL | Status: DC

## 2013-11-04 NOTE — Lactation Note (Signed)
This note was copied from the chart of Veronica Beasley. Lactation Consultation Note  Patient Name: Veronica Beasley ZOXWR'UToday's Date: 11/04/2013 Reason for consult: Follow-up assessment  Mom reports baby is nursing well, sleepy at this visit. Baby just came off right breast and Mom re-latched to left breast. Demonstrated a good rhythmic suck with stimulation, 1 swallow noted. Mom reports her breasts are starting to fill. Denies questions or concerns. Engorgement care reviewed if needed. Basic teaching reviewed. Advised of OP services and support group.  Maternal Data    Feeding Feeding Type: Breast Fed Length of feed: 12 min  LATCH Score/Interventions Latch: Grasps breast easily, tongue down, lips flanged, rhythmical sucking.  Audible Swallowing: A few with stimulation  Type of Nipple: Everted at rest and after stimulation  Comfort (Breast/Nipple): Soft / non-tender     Hold (Positioning): Assistance needed to correctly position infant at breast and maintain latch.  LATCH Score: 8  Lactation Tools Discussed/Used     Consult Status Consult Status: Complete Date: 11/04/13 Follow-up type: In-patient    Alfred LevinsGranger, Lennox Leikam Ann 11/04/2013, 10:49 AM

## 2013-11-04 NOTE — Progress Notes (Addendum)
PSYCHOSOCIAL ASSESSMENT - MATERNAL/CHILD Name:    Veronica Beasley                                                      Age: 23        Referral Date: 11/03/2013  Reason/Source: Depression/Anxiety  I.          FAMILY/HOME ENVIRONMENT A. Child's Legal Guardian _x__Parent(s) ___Grandparent ___Foster parent ___DSS_____ Name: Dutch Gray                                       DOB: 1990-04-08                     Age: 55  Address: [address] [place], [place] [address]  Name:  Lennette Bihari                                                     DOB: //                     Age:   Address: same as above  B. Other Household Members/Support Persons   C. Other Support: MOB stated that her parents are supportive.  She also identified having numerous friends who are very supportive and excited to help her transition to being a mother.  MOB did not identify any core beliefs that would interfere with her ability to reach out for help and support.    PSYCHOSOCIAL DATA A. Information Source                                                                                              __Patient Interview      x__Family Interview        B. 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  FOB is currently working at [employer] but stated that he is currently looking for a new job. __Medicaid    [place]:                 _x_Private Insurance: BCBS                  __Self Pay  __Food Stamps   __WIC  __Work First     __Public Housing     __Section 8    __Maternity Care Coordination/Child Service Coordination/Early Intervention   ___School:  Grade:  __Other:   [employer]achi [employer] and Environment Information Cultural Issues Impacting Care: None reported  STRENGTHS _x__Supportive family/friends _x__Adequate Resources ___Compliance with medical plan _x__Home prepared for Child (including basic  supplies) ___Understanding of illness      ___Other: RISK FACTORS AND CURRENT PROBLEMS                                                                                                                                                                                                                                                 Pt              Family      Substance Abuse                                                                  ___              ___         Mental Illness                                                                         _x__              ___             Family/Relationship Issues                                                    ___               ___             Abuse/Neglect/Domestic Violence  ___              ___             Financial Resources                                                               ___              ___             Transportation                                                                        ___               ___             DSS Involvement                                                                      ___              ___             Adjustment to Illness                                                               ___              ___             Knowledge/Cognitive Deficit                                                   ___              ___             Compliance with Treatment                                                   ___              ___             Basic Needs (food, housing, etc.)  ___              ___             Housing Concerns                                                                  ___              ___ Other_____________________________________________________________                                  SOCIAL WORK ASSESSMENT CSW met with the MOB in her room in order to complete the assessment. Consult was ordered due to MOB  presenting with a history of anxiety.  MOB was receptive to the [place] visit and provided consent for the FOB to be present for the entire visit.  The visit was frequently interrupted due to multiple members of the treatment team meeting with the MOB, but MOB was able to continue to be engaged and was willing to discuss her anxiety and reason for CSW consult.  MOB presented with full range in affect and was observed to be in a pleasant mood. She was observed to be interacting and bonding with her baby, and she presented with self-awareness related to her anxiety symptoms. CSW did not note any acute mental health symptoms.  MOB expressed excitement as she transitions to the postpartum period.  She smiled frequently as she reflected upon preparing for discharge. MOB stated that the home is prepared, and that she feels supported by her support system and her employer.  MOB denied any recent psychosocial stressors that may impact her transition into the postpartum period.  While MOB was talking with another member of the treatment team, CSW continued to provide support to the FOB.  He expressed desire to secure new employment, but identified stress associated with difficulties finding a new job.  CSW validated his feelings and normalized the stress he feels as he now has a baby. CSW continued to explore with the FOB normative thoughts and feelings he may experience as he experiences this role transition.  FOB acknowledged the statements and stated that he has watched numerous friends make the transition and verbalized understanding that it will take time for him to adjust to his new role.  MOB endorsed history of anxiety early in her pregnancy since she was initially very overwhelmed.  MOB stated that she would have occasional panic attacks, and would experience shortness of breath and would "feel overwhelmed".  MOB stated that she was prescribed medication PRN, but she only took 2 tablets since the symptoms resolved  as her pregnancy progressed.  MOB presented with self-awareness of effective emotional regulation skills as she recalled how deep breathing assists with stabilization of mood.  She is also able to identify early signs that a panic attack may occur and she expressed awareness on how this will allow her to set the baby down before her symptoms would escalate to a panic attack.  MOB denied recent anxiety but acknowledged that it is normal for anxiety to increase in the postpartum period.  MOB was receptive to education on postpartum depression and agreed to contact MD if symptoms occur.  No barriers to discharge.  SOCIAL WORK PLAN             _x__No Further Intervention Required/No Barriers to Discharge              ___Psychosocial Support and Ongoing Assessment of Needs              _x__Patient/Family Education: Postpartum depression and anxiety             ___Child Protective Services Report   County___________ Date___/____/____              ___Information/Referral to [employer] Resources_________________________              ___Other:

## 2013-11-04 NOTE — Discharge Summary (Signed)
Obstetric Discharge Summary Reason for Admission: onset of labor Prenatal Procedures: ultrasound Intrapartum Procedures: vacuum Postpartum Procedures: none Complications-Operative and Postpartum: 2 degree perineal laceration Hemoglobin  Date Value Ref Range Status  11/04/2013 10.8* 12.0 - 15.0 g/dL Final     HCT  Date Value Ref Range Status  11/04/2013 32.6* 36.0 - 46.0 % Final    Physical Exam:  General: alert and cooperative Lochia: appropriate Uterine Fundus: firm Incision: perineum intact DVT Evaluation: No evidence of DVT seen on physical exam. Negative Homan's sign. No cords or calf tenderness. No significant calf/ankle edema.  Discharge Diagnoses: Term Pregnancy-delivered  Discharge Information: Date: 11/04/2013 Activity: pelvic rest Diet: routine Medications: PNV and Ibuprofen Condition: stable Instructions: refer to practice specific booklet Discharge to: home   Newborn Data: Live born female  Birth Weight: 8 lb 6 oz (3800 g) APGAR: 8, 9  Home with mother.  CURTIS,CAROL G 11/04/2013, 8:13 AM

## 2013-11-04 NOTE — Progress Notes (Signed)
Post Partum Day 1 Subjective: no complaints, up ad lib, voiding and tolerating PO  Objective: Blood pressure 114/69, pulse 75, temperature 99.6 F (37.6 C), temperature source Oral, resp. rate 18, height 5\' 7"  (1.702 m), weight 170 lb (77.111 kg), SpO2 100.00%, unknown if currently breastfeeding.  Physical Exam:  General: alert and cooperative Lochia: appropriate Uterine Fundus: firm Incision: perineum intact DVT Evaluation: No evidence of DVT seen on physical exam. Negative Homan's sign. No cords or calf tenderness. No significant calf/ankle edema.   Recent Labs  11/03/13 0153 11/04/13 0600  HGB 11.7* 10.8*  HCT 34.8* 32.6*    Assessment/Plan: Discharge home Plans outpatient circ   LOS: 1 day   CURTIS,CAROL G 11/04/2013, 8:07 AM

## 2013-11-13 NOTE — H&P (Signed)
Veronica Beasley is a 23 y.o. female presenting AT 39.6 with sol.  Neg GBS. Maternal Medical History:  Reason for admission: Contractions.   Contractions: Onset was 3-5 hours ago.   Frequency: regular.   Perceived severity is moderate.    Fetal activity: Perceived fetal activity is normal.    Prenatal complications: no prenatal complications Prenatal Complications - Diabetes: none.    OB History   Grav Para Term Preterm Abortions TAB SAB Ect Mult Living   1 1 1       1      Past Medical History  Diagnosis Date  . Hypoglycemia   . Chlamydia   . Anxiety   . Depression   . Hypoglycemia     pt just has a lot of protein snacks to manage this   Past Surgical History  Procedure Laterality Date  . No past surgeries     Family History: family history includes Hypertension in her mother. There is no history of Other. Social History:  reports that she has quit smoking. She has never used smokeless tobacco. She reports that she does not drink alcohol or use illicit drugs.   Prenatal Transfer Tool  Maternal Diabetes: No Genetic Screening: Normal Maternal Ultrasounds/Referrals: Normal Fetal Ultrasounds or other Referrals:  None Maternal Substance Abuse:  No Significant Maternal Medications:  None Significant Maternal Lab Results:  None Other Comments:  None  ROS  Dilation: 10 Effacement (%): 100 Station: +2 Exam by:: S Nix RN Blood pressure 114/69, pulse 75, temperature 99.6 F (37.6 C), temperature source Oral, resp. rate 18, height 5\' 7"  (1.702 m), weight 77.111 kg (170 lb), SpO2 100.00%, unknown if currently breastfeeding. Maternal Exam:  Uterine Assessment: Contraction strength is moderate.  Contraction frequency is regular.   Abdomen: Patient reports no abdominal tenderness. Fundal height is C/W DATES.   Estimated fetal weight is 7.   Fetal presentation: vertex  Pelvis: adequate for delivery.   Cervix: 4 CM  Fetal Exam Fetal State Assessment: Category I - tracings  are normal.     Physical Exam  Prenatal labs: ABO, Rh: A/Positive/-- (04/27 0000) Antibody: Negative (04/27 0000) Rubella: Immune (04/27 0000) RPR: NON REAC (09/28 0153)  HBsAg: Negative (04/27 0000)  HIV: Non-reactive (04/27 0000)  GBS: Negative (08/27 0000)   Assessment/Plan: IUP at 39.6 with SOL Routine labor and delivery   Rasmus Preusser S 11/13/2013, 11:34 AM

## 2013-12-08 ENCOUNTER — Encounter (HOSPITAL_COMMUNITY): Payer: Self-pay

## 2015-03-15 DIAGNOSIS — B349 Viral infection, unspecified: Secondary | ICD-10-CM | POA: Diagnosis not present

## 2015-06-27 DIAGNOSIS — J45909 Unspecified asthma, uncomplicated: Secondary | ICD-10-CM | POA: Diagnosis not present

## 2015-07-05 ENCOUNTER — Emergency Department (HOSPITAL_COMMUNITY)
Admission: EM | Admit: 2015-07-05 | Discharge: 2015-07-05 | Disposition: A | Discharge status: Home / Self Care | Payer: 59 | Attending: Emergency Medicine | Admitting: Emergency Medicine

## 2015-07-05 ENCOUNTER — Encounter (HOSPITAL_COMMUNITY): Payer: Self-pay | Admitting: Emergency Medicine

## 2015-07-05 DIAGNOSIS — Z87891 Personal history of nicotine dependence: Secondary | ICD-10-CM | POA: Insufficient documentation

## 2015-07-05 DIAGNOSIS — R10816 Epigastric abdominal tenderness: Secondary | ICD-10-CM | POA: Insufficient documentation

## 2015-07-05 DIAGNOSIS — R55 Syncope and collapse: Secondary | ICD-10-CM

## 2015-07-05 DIAGNOSIS — Z791 Long term (current) use of non-steroidal anti-inflammatories (NSAID): Secondary | ICD-10-CM | POA: Diagnosis not present

## 2015-07-05 DIAGNOSIS — R10815 Periumbilic abdominal tenderness: Secondary | ICD-10-CM | POA: Diagnosis not present

## 2015-07-05 LAB — BASIC METABOLIC PANEL
Anion gap: 5 (ref 5–15)
BUN: 14 mg/dL (ref 6–20)
CO2: 27 mmol/L (ref 22–32)
Calcium: 9.8 mg/dL (ref 8.9–10.3)
Chloride: 109 mmol/L (ref 101–111)
Creatinine, Ser: 0.62 mg/dL (ref 0.44–1.00)
GFR calc Af Amer: >60 mL/min (ref >60)
GFR calc non Af Amer: >60 mL/min (ref >60)
Glucose, Bld: 88 mg/dL (ref 65–99)
Potassium: 3.7 mmol/L (ref 3.5–5.1)
Sodium: 141 mmol/L (ref 135–145)

## 2015-07-05 LAB — HEPATIC FUNCTION PANEL
ALT: 11 U/L — ABNORMAL LOW (ref 14–54)
AST: 16 U/L (ref 15–41)
Albumin: 4.6 g/dL (ref 3.5–5.0)
Alkaline Phosphatase: 46 U/L (ref 38–126)
Bilirubin, Direct: 0.1 mg/dL (ref 0.1–0.5)
Indirect Bilirubin: 0.7 mg/dL (ref 0.3–0.9)
Total Bilirubin: 0.8 mg/dL (ref 0.3–1.2)
Total Protein: 7.5 g/dL (ref 6.5–8.1)

## 2015-07-05 LAB — URINALYSIS, ROUTINE W REFLEX MICROSCOPIC
Bilirubin Urine: NEGATIVE
Glucose, UA: NEGATIVE mg/dL
Hgb urine dipstick: NEGATIVE
Ketones, ur: NEGATIVE mg/dL
Leukocytes, UA: NEGATIVE
Nitrite: NEGATIVE
Protein, ur: NEGATIVE mg/dL
Specific Gravity, Urine: 1.02 (ref 1.005–1.030)
pH: 7.5 (ref 5.0–8.0)

## 2015-07-05 LAB — CBC
HCT: 39.3 % (ref 36.0–46.0)
Hemoglobin: 13.2 g/dL (ref 12.0–15.0)
MCH: 28 pg (ref 26.0–34.0)
MCHC: 33.6 g/dL (ref 30.0–36.0)
MCV: 83.4 fL (ref 78.0–100.0)
Platelets: 252 10*3/uL (ref 150–400)
RBC: 4.71 MIL/uL (ref 3.87–5.11)
RDW: 13 % (ref 11.5–15.5)
WBC: 5.1 10*3/uL (ref 4.0–10.5)

## 2015-07-05 LAB — LIPASE, BLOOD: Lipase: 28 U/L (ref 11–51)

## 2015-07-05 LAB — CBG MONITORING, ED: Glucose-Capillary: 87 mg/dL (ref 65–99)

## 2015-07-05 LAB — I-STAT BETA HCG BLOOD, ED (MC, WL, AP ONLY): I-stat hCG, quantitative: <5 m[IU]/mL (ref <5)

## 2015-07-05 MED ORDER — SODIUM CHLORIDE 0.9 % IV BOLUS (SEPSIS)
1000.0000 mL | Freq: Once | INTRAVENOUS | Status: AC
  Administered 2015-07-05: 1000 mL via INTRAVENOUS

## 2015-07-05 MED ORDER — ONDANSETRON HCL 4 MG/2ML IJ SOLN
4.0000 mg | Freq: Once | INTRAMUSCULAR | Status: AC
  Administered 2015-07-05: 4 mg via INTRAVENOUS
  Filled 2015-07-05: qty 2

## 2015-07-05 NOTE — ED Provider Notes (Signed)
CSN: [id number]     Arrival date & time 07/05/15  1346 History   First MD Initiated Contact with Patient 07/05/15 1502     Chief Complaint  Patient presents with  . Near Syncope     (Consider location/radiation/quality/duration/timing/severity/associated sxs/prior Treatment) HPI   25 year old female with no pertinent past medical history who presents the ED with complaint of near-syncope, onset 1 PM. Patient reports after getting up from a nap this afternoon she began doing laundry. She states after bending over multiple times to change out the laundry she began having sudden onset of lightheadedness, seen black spots, and nausea. She felt as if she were about to pass out however she notes she was able to catch herself on the washer dryer prior to falling. Denies LOC. Patient reports after episode she has continued to have mild lightheadedness, nausea and a mild headache. She reports her other symptoms have since resolved. She also notes yesterday evening while at work she had an episode of vomiting. She notes this morning when she woke up she began having mild upper abdominal discomfort. Denies fever, chills, neck stiffness, photophobia, nasal congestion, rhinorrhea, sore throat, cough, shortness of breath, chest pain, diarrhea, constipation, urinary symptoms, vaginal bleeding, vaginal discharge, numbness, tingling, weakness, syncope, seizure. Patient denies taking any medications prior to arrival. Patient denies history of syncopal or near syncopal events. She notes she has not been drinking much since her episode of vomiting last night. LMP appx. 1.5 weeks ago.   Past Medical History  Diagnosis Date  . Hypoglycemia   . Chlamydia   . Anxiety   . Depression   . Hypoglycemia     pt just has a lot of protein snacks to manage this   Past Surgical History  Procedure Laterality Date  . No past surgeries     Family History  Problem Relation Age of Onset  . Other Neg Hx   . Hypertension  Mother    Social History  Substance Use Topics  . Smoking status: Former Games developer  . Smokeless tobacco: Never Used     Comment: briefly at age 51  . Alcohol Use: No     Comment: occ   OB History    Gravida Para Term Preterm AB TAB SAB Ectopic Multiple Living   Review of Systems  Eyes: Positive for visual disturbance.  Gastrointestinal: Positive for nausea, vomiting and abdominal pain.  Neurological: Positive for light-headedness and headaches.  All other systems reviewed and are negative.     Allergies  Food  Home Medications   Prior to Admission medications   Medication Sig Start Date End Date Taking? Authorizing Provider  ibuprofen (ADVIL,MOTRIN) 600 MG tablet Take 1 tablet (600 mg total) by mouth every 6 (six) hours. Patient not taking: Reported on 07/05/2015 11/04/13   Julio Sicks, NP   BP 117/81 mmHg  Pulse 69  Temp(Src) 98.8 F (37.1 C) (Oral)  Resp 16  SpO2 99%  Breastfeeding? Yes Physical Exam  Constitutional: She is oriented to person, place, and time. She appears well-developed and well-nourished. No distress.  HENT:  Head: Normocephalic and atraumatic. Head is without raccoon's eyes, without Battle's sign, without abrasion, without contusion and without laceration.  Right Ear: Tympanic membrane normal. No hemotympanum.  Left Ear: Tympanic membrane normal. No hemotympanum.  Nose: Nose normal.  Mouth/Throat: Uvula is midline, oropharynx is clear and moist and mucous membranes are normal. No oropharyngeal exudate,  posterior oropharyngeal edema, posterior oropharyngeal erythema or tonsillar abscesses.  Eyes: Conjunctivae and EOM are normal. Pupils are equal, round, and reactive to light. Right eye exhibits no discharge. Left eye exhibits no discharge. No scleral icterus.  Neck: Normal range of motion. Neck supple.  Cardiovascular: Normal rate, regular rhythm, normal heart sounds and intact distal pulses.   Pulmonary/Chest: Effort normal and  breath sounds normal. No respiratory distress. She has no wheezes. She has no rales. She exhibits no tenderness.  Abdominal: Soft. Bowel sounds are normal. She exhibits no distension and no mass. There is tenderness (mild tenderness noted to epigastric and periumbilical region). There is no rebound and no guarding.  Musculoskeletal: Normal range of motion. She exhibits no edema.  Lymphadenopathy:    She has no cervical adenopathy.  Neurological: She is alert and oriented to person, place, and time. She has normal strength. No cranial nerve deficit or sensory deficit. Coordination normal.  Skin: Skin is warm and dry. She is not diaphoretic.  Nursing note and vitals reviewed.   ED Course  Procedures (including critical care time) Labs Review Labs Reviewed  HEPATIC FUNCTION PANEL - Abnormal; Notable for the following:    ALT 11 (*)    All other components within normal limits  BASIC METABOLIC PANEL  CBC  URINALYSIS, ROUTINE W REFLEX MICROSCOPIC (NOT AT [facility])  LIPASE, BLOOD  CBG MONITORING, ED  I-STAT BETA HCG BLOOD, ED ([facility], [facility], [facility] ONLY)    Imaging Review No results found. I have personally reviewed and evaluated these images and lab results as part of my medical decision-making.   EKG Interpretation None      MDM   Final diagnoses:  Near syncope    Pt presents with near syncopal episode that occurred prior to arrival while doing laundry. She also endorses having episode of N/V yesterday with associated abdominal pain. Pt reports decreased oral intake over the past 24 hours. VSS. Exam revealed mild epigastric and periumbilical tenderness, no peritoneal signs. No neuro deficits. Remaining exam unremarkable. Pt given IVF and zofran. EKG showed sinus rhythm. Pregnancy negative. CBG 87. Labs and urine unremarkable. Orthostatics negative.  On reevaluation pt reports she is feeling significantly better. Pt able to stand and ambulate without reported lightheadedness, no ataxia noted.  Pt able to tolerate PO. I suspect pt's episode is likely due to near syncopal episode due to dehydration. Plan to d/c pt home with symptomatic tx and PCP follow up. Discussed strict return precautions with pt.    Satira Sarkicole Elizabeth MayesvilleNadeau, [place] JerseyPA-C 07/05/15 1723  Loren Raceravid [place], MD 07/06/15 [phone]

## 2015-07-05 NOTE — ED Notes (Signed)
Pt reports 1 episode emesis last night, improvement in symptoms, then this morning had dizziness, transient vision loss, near syncope, was able to catch self prior to falling. Currently pt feels weak, headache, nausea.

## 2015-07-05 NOTE — ED Notes (Signed)
Lab states they will run labs with previously collected blood.

## 2015-07-05 NOTE — Discharge Instructions (Signed)
I recommend drinking at least six 8 ounce glasses of water daily to remain hydrated at home.  Follow up with your primary care provider within the next week. Please return to the Emergency Department if symptoms worsen or new onset of fever, headache, neck stiffness, visual changes, vomiting, unable to keep fluids down, shortness of breath, chest pain, lightheadedness, dizziness, numbness, tingling, weakness, syncope, seizure.

## 2015-07-05 NOTE — ED Notes (Signed)
Discharge instructions and follow up care reviewed with patient. Patient verbalized understanding. 

## 2015-07-05 NOTE — ED Notes (Signed)
PA at bedside.

## 2015-07-13 DIAGNOSIS — N870 Mild cervical dysplasia: Secondary | ICD-10-CM | POA: Diagnosis not present

## 2015-07-13 DIAGNOSIS — Z01419 Encounter for gynecological examination (general) (routine) without abnormal findings: Secondary | ICD-10-CM | POA: Diagnosis not present

## 2015-07-13 DIAGNOSIS — Z681 Body mass index (BMI) 19 or less, adult: Secondary | ICD-10-CM | POA: Diagnosis not present

## 2015-07-13 DIAGNOSIS — N72 Inflammatory disease of cervix uteri: Secondary | ICD-10-CM | POA: Diagnosis not present

## 2015-08-06 DIAGNOSIS — N879 Dysplasia of cervix uteri, unspecified: Secondary | ICD-10-CM | POA: Diagnosis not present

## 2015-09-21 DIAGNOSIS — Z3009 Encounter for other general counseling and advice on contraception: Secondary | ICD-10-CM | POA: Diagnosis not present

## 2016-02-24 ENCOUNTER — Telehealth: Payer: 59 | Admitting: Physician Assistant

## 2016-02-24 ENCOUNTER — Other Ambulatory Visit: Payer: Self-pay | Admitting: Physician Assistant

## 2016-02-24 DIAGNOSIS — R112 Nausea with vomiting, unspecified: Secondary | ICD-10-CM

## 2016-02-24 MED ORDER — ONDANSETRON HCL 4 MG PO TABS
4.0000 mg | ORAL_TABLET | Freq: Three times a day (TID) | ORAL | 0 refills | Status: DC | PRN
Start: 2016-02-24 — End: 2018-11-08

## 2016-02-24 NOTE — Progress Notes (Signed)

## 2016-05-01 DIAGNOSIS — F419 Anxiety disorder, unspecified: Secondary | ICD-10-CM | POA: Diagnosis not present

## 2016-10-09 DIAGNOSIS — Z01419 Encounter for gynecological examination (general) (routine) without abnormal findings: Secondary | ICD-10-CM | POA: Diagnosis not present

## 2016-10-10 DIAGNOSIS — Z01419 Encounter for gynecological examination (general) (routine) without abnormal findings: Secondary | ICD-10-CM | POA: Diagnosis not present

## 2016-10-10 DIAGNOSIS — Z6820 Body mass index (BMI) 20.0-20.9, adult: Secondary | ICD-10-CM | POA: Diagnosis not present

## 2016-10-17 DIAGNOSIS — Z30432 Encounter for removal of intrauterine contraceptive device: Secondary | ICD-10-CM | POA: Diagnosis not present

## 2016-10-23 ENCOUNTER — Telehealth: Payer: 59 | Admitting: Family

## 2016-10-23 DIAGNOSIS — B3731 Acute candidiasis of vulva and vagina: Secondary | ICD-10-CM

## 2016-10-23 DIAGNOSIS — B373 Candidiasis of vulva and vagina: Secondary | ICD-10-CM

## 2016-10-23 MED ORDER — FLUCONAZOLE 150 MG PO TABS: 150.0000 mg | ORAL_TABLET | Freq: Once | ORAL | 0 refills | Status: AC

## 2016-10-23 NOTE — Progress Notes (Signed)
Thank you for the details you put in the comment boxes. Those details really help us take better care of you.   We are sorry that you are not feeling well. Here is how we plan to help! Based on what you shared with me it looks like you: May have a yeast vaginosis  Vaginosis is an inflammation of the vagina that can result in discharge, itching and pain. The cause is usually a change in the normal balance of vaginal bacteria or an infection. Vaginosis can also result from reduced estrogen levels after menopause.  The most common causes of vaginosis are:   Bacterial vaginosis which results from an overgrowth of one on several organisms that are normally present in your vagina.   Yeast infections which are caused by a naturally occurring fungus called candida.   Vaginal atrophy (atrophic vaginosis) which results from the thinning of the vagina from reduced estrogen levels after menopause.   Trichomoniasis which is caused by a parasite and is commonly transmitted by sexual intercourse.  Factors that increase your risk of developing vaginosis include: . Medications, such as antibiotics and steroids . Uncontrolled diabetes . Use of hygiene products such as bubble bath, vaginal spray or vaginal deodorant . Douching . Wearing damp or tight-fitting clothing . Using an intrauterine device (IUD) for birth control . Hormonal changes, such as those associated with pregnancy, birth control pills or menopause . Sexual activity . Having a sexually transmitted infection  Your treatment plan is A single Diflucan (fluconazole) 150mg tablet once.  I have electronically sent this prescription into the pharmacy that you have chosen.  Be sure to take all of the medication as directed. Stop taking any medication if you develop a rash, tongue swelling or shortness of breath. Mothers who are breast feeding should consider pumping and discarding their breast milk while on these antibiotics. However, there is no  consensus that infant exposure at these doses would be harmful.  Remember that medication creams can weaken latex condoms. .   HOME CARE:  Good hygiene may prevent some types of vaginosis from recurring and may relieve some symptoms:  . Avoid baths, hot tubs and whirlpool spas. Rinse soap from your outer genital area after a shower, and dry the area well to prevent irritation. Don't use scented or harsh soaps, such as those with deodorant or antibacterial action. . Avoid irritants. These include scented tampons and pads. . Wipe from front to back after using the toilet. Doing so avoids spreading fecal bacteria to your vagina.  Other things that may help prevent vaginosis include:  . Don't douche. Your vagina doesn't require cleansing other than normal bathing. Repetitive douching disrupts the normal organisms that reside in the vagina and can actually increase your risk of vaginal infection. Douching won't clear up a vaginal infection. . Use a latex condom. Both female and female latex condoms may help you avoid infections spread by sexual contact. . Wear cotton underwear. Also wear pantyhose with a cotton crotch. If you feel comfortable without it, skip wearing underwear to bed. Yeast thrives in moist environments Your symptoms should improve in the next day or two.  GET HELP RIGHT AWAY IF:  . You have pain in your lower abdomen ( pelvic area or over your ovaries) . You develop nausea or vomiting . You develop a fever . Your discharge changes or worsens . You have persistent pain with intercourse . You develop shortness of breath, a rapid pulse, or you faint.  These symptoms   could be signs of problems or infections that need to be evaluated by a medical provider now.  MAKE SURE YOU    Understand these instructions.  Will watch your condition.  Will get help right away if you are not doing well or get worse.  Your e-visit answers were reviewed by a board certified advanced  clinical practitioner to complete your personal care plan. Depending upon the condition, your plan could have included both over the counter or prescription medications. Please review your pharmacy choice to make sure that you have choses a pharmacy that is open for you to pick up any needed prescription, Your safety is important to us. If you have drug allergies check your prescription carefully.   You can use MyChart to ask questions about today's visit, request a non-urgent call back, or ask for a work or school excuse for 24 hours related to this e-Visit. If it has been greater than 24 hours you will need to follow up with your provider, or enter a new e-Visit to address those concerns. You will get a MyChart message within the next two days asking about your experience. I hope that your e-visit has been valuable and will speed your recovery.  

## 2016-11-21 MED FILL — XULANE PATCH: 150-35 | 28 days supply | Qty: 3 | Fill #0

## 2016-11-23 DIAGNOSIS — Z3202 Encounter for pregnancy test, result negative: Secondary | ICD-10-CM | POA: Diagnosis not present

## 2016-11-23 DIAGNOSIS — R87810 Cervical high risk human papillomavirus (HPV) DNA test positive: Secondary | ICD-10-CM | POA: Diagnosis not present

## 2016-11-23 DIAGNOSIS — R87610 Atypical squamous cells of undetermined significance on cytologic smear of cervix (ASC-US): Secondary | ICD-10-CM | POA: Diagnosis not present

## 2016-11-23 DIAGNOSIS — N870 Mild cervical dysplasia: Secondary | ICD-10-CM | POA: Diagnosis not present

## 2016-12-11 DIAGNOSIS — J069 Acute upper respiratory infection, unspecified: Secondary | ICD-10-CM | POA: Diagnosis not present

## 2016-12-11 MED FILL — predniSONE 10 MG TABS: 10 | 4 days supply | Qty: 10 | Fill #0

## 2016-12-11 MED FILL — BENZONATATE 200 MG CAP: 200 | 7 days supply | Qty: 21 | Fill #0

## 2016-12-11 MED FILL — IPRATROPIUM 0.06% SPRAY: 0.06 | 30 days supply | Qty: 15 | Fill #0

## 2016-12-25 MED FILL — XULANE PATCH: 150-35 | 28 days supply | Qty: 3 | Fill #1

## 2016-12-26 ENCOUNTER — Telehealth: Payer: 59 | Admitting: Nurse Practitioner

## 2016-12-26 DIAGNOSIS — B373 Candidiasis of vulva and vagina: Secondary | ICD-10-CM | POA: Diagnosis not present

## 2016-12-26 DIAGNOSIS — B3731 Acute candidiasis of vulva and vagina: Secondary | ICD-10-CM

## 2016-12-26 MED ORDER — FLUCONAZOLE 150 MG PO TABS: 150.0000 mg | ORAL_TABLET | Freq: Once | ORAL | 0 refills | Status: AC

## 2016-12-26 NOTE — Progress Notes (Signed)

## 2017-01-26 MED FILL — XULANE PATCH: 150-35 | 28 days supply | Qty: 3 | Fill #2

## 2017-02-19 ENCOUNTER — Telehealth: Payer: 59 | Admitting: Family

## 2017-02-19 DIAGNOSIS — B3731 Acute candidiasis of vulva and vagina: Secondary | ICD-10-CM

## 2017-02-19 DIAGNOSIS — B373 Candidiasis of vulva and vagina: Secondary | ICD-10-CM

## 2017-02-19 MED ORDER — FLUCONAZOLE 150 MG PO TABS: 150.0000 mg | ORAL_TABLET | Freq: Once | ORAL | 0 refills | Status: AC

## 2017-02-19 NOTE — Progress Notes (Signed)

## 2017-02-28 DIAGNOSIS — Z3009 Encounter for other general counseling and advice on contraception: Secondary | ICD-10-CM | POA: Diagnosis not present

## 2017-02-28 MED FILL — LARIN FE 1-20 TABLET: 1-20 | 28 days supply | Qty: 28 | Fill #0

## 2017-03-27 MED FILL — LARIN FE 1-20 TABLET: 1-20 | 84 days supply | Qty: 84 | Fill #1

## 2017-05-03 DIAGNOSIS — N912 Amenorrhea, unspecified: Secondary | ICD-10-CM | POA: Diagnosis not present

## 2017-05-08 DIAGNOSIS — Z0000 Encounter for general adult medical examination without abnormal findings: Secondary | ICD-10-CM | POA: Diagnosis not present

## 2017-06-14 MED FILL — NORETHIN-ESTRAD-FERR 1-0.02: 1-20 | 84 days supply | Qty: 84 | Fill #2

## 2017-09-18 MED FILL — NORETHIN-ESTRAD-FERR 1-0.02: 1-20 | 56 days supply | Qty: 56 | Fill #0

## 2017-09-25 ENCOUNTER — Ambulatory Visit (INDEPENDENT_AMBULATORY_CARE_PROVIDER_SITE_OTHER): Payer: Self-pay | Admitting: Nurse Practitioner

## 2017-09-25 VITALS — BP 125/70 | HR 66 | Temp 98.3°F | Resp 16 | Wt 131.6 lb

## 2017-09-25 DIAGNOSIS — M542 Cervicalgia: Secondary | ICD-10-CM

## 2017-09-25 DIAGNOSIS — S139XXA Sprain of joints and ligaments of unspecified parts of neck, initial encounter: Secondary | ICD-10-CM

## 2017-09-25 MED ORDER — CYCLOBENZAPRINE HCL 10 MG PO TABS: 10.0000 mg | ORAL_TABLET | Freq: Three times a day (TID) | ORAL | 0 refills | Status: AC | PRN

## 2017-09-25 MED FILL — CYCLOBENZAPRINE 10 MG TAB: 10 | 10 days supply | Qty: 30 | Fill #0

## 2017-09-25 NOTE — Patient Instructions (Signed)
Cervical Sprain -Medications as prescribed.  Ibuprofen 800mg  every 8 hours for 3 days.  Take with food and water. -Use muscle relaxer as needed. -Apply ice to the neck for 48 hours, then switch to warm moist heat for 48 hours. -Perform stretching exercises provided. -May use over-the-counter lidocaine patch for neck pain. -Sleep in a position were cervical spine is straight. -Follow up in the ER if you develop numbness, tingling or weakness in the upper extremities.  -Follow up with PCP if symptoms do not improve.   A cervical sprain is a stretch or tear in one or more of the tough, cord-like tissues that connect bones (ligaments) in the neck. Cervical sprains can range from mild to severe. Severe cervical sprains can cause the spinal bones (vertebrae) in the neck to be unstable. This can lead to spinal cord damage and can result in serious nervous system problems. The amount of time that it takes for a cervical sprain to get better depends on the cause and extent of the injury. Most cervical sprains heal in 4-6 weeks. What are the causes? Cervical sprains may be caused by an injury (trauma), such as from a motor vehicle accident, a fall, or sudden forward and backward whipping movement of the head and neck (whiplash injury). Mild cervical sprains may be caused by wear and tear over time, such as from poor posture, sitting in a chair that does not provide support, or looking up or down for long periods of time. What increases the risk? The following factors may make you more likely to develop this condition:  Participating in activities that have a high risk of trauma to the neck. These include contact sports, auto racing, gymnastics, and diving.  Taking risks when driving or riding in a motor vehicle, such as speeding.  Having osteoarthritis of the spine.  Having poor strength and flexibility of the neck.  A previous neck injury.  Having poor posture.  Spending a lot of time in  certain positions that put stress on the neck, such as sitting at a computer for long periods of time.  What are the signs or symptoms? Symptoms of this condition include:  Pain, soreness, stiffness, tenderness, swelling, or a burning sensation in the front, back, or sides of the neck.  Sudden tightening of neck muscles that you cannot control (muscle spasms).  Pain in the shoulders or upper back.  Limited ability to move the neck.  Headache.  Dizziness.  Nausea.  Vomiting.  Weakness, numbness, or tingling in a hand or an arm.  Symptoms may develop right away after injury, or they may develop over a few days. In some cases, symptoms may go away with treatment and return (recur) over time. How is this diagnosed? This condition may be diagnosed based on:  Your medical history.  Your symptoms.  Any recent injuries or known neck problems that you have, such as arthritis in the neck.  A physical exam.  Imaging tests, such as: ? X-rays. ? MRI. ? CT scan.  How is this treated? This condition is treated by resting and icing the injured area and doing physical therapy exercises. Depending on the severity of your condition, treatment may also include:  Keeping your neck in place (immobilized) for periods of time. This may be done using: ? A cervical collar. This supports your chin and the back of your head. ? A cervical traction device. This is a sling that holds up your head. This removes weight and pressure from your  neck, and it may help to relieve pain.  Medicines that help to relieve pain and inflammation.  Medicines that help to relax your muscles (muscle relaxants).  Surgery. This is rare.  Follow these instructions at home: If you have a cervical collar:  Wear it as told by your health care provider. Do not remove the collar unless instructed by your health care provider.  Ask your health care provider before you make any adjustments to your collar.  If you  have long hair, keep it outside of the collar.  Ask your health care provider if you can remove the collar for cleaning and bathing. If you are allowed to remove the collar for cleaning or bathing: ? Follow instructions from your health care provider about how to remove the collar safely. ? Clean the collar by wiping it with mild soap and water and drying it completely. ? If your collar has removable pads, remove them every 1-2 days and wash them by hand with soap and water. Let them air-dry completely before you put them back in the collar. ? Check your skin under the collar for irritation or sores. If you see any, tell your health care provider. Managing pain, stiffness, and swelling  If directed, use a cervical traction device as told by your health care provider.  If directed, apply heat to the affected area before you do your physical therapy or as often as told by your health care provider. Use the heat source that your health care provider recommends, such as a moist heat pack or a heating pad. ? Place a towel between your skin and the heat source. ? Leave the heat on for 20-30 minutes. ? Remove the heat if your skin turns bright red. This is especially important if you are unable to feel pain, heat, or cold. You may have a greater risk of getting burned.  If directed, put ice on the affected area: ? Put ice in a plastic bag. ? Place a towel between your skin and the bag. ? Leave the ice on for 20 minutes, 2-3 times a day. Activity  Do not drive while wearing a cervical collar. If you do not have a cervical collar, ask your health care provider if it is safe to drive while your neck heals.  Do not drive or use heavy machinery while taking prescription pain medicine or muscle relaxants, unless your health care provider approves.  Do not lift anything that is heavier than 10 lb (4.5 kg) until your health care provider tells you that it is safe.  Rest as directed by your health care  provider. Avoid positions and activities that make your symptoms worse. Ask your health care provider what activities are safe for you.  If physical therapy was prescribed, do exercises as told by your health care provider or physical therapist. General instructions  Take over-the-counter and prescription medicines only as told by your health care provider.  Do not use any products that contain nicotine or tobacco, such as cigarettes and e-cigarettes. These can delay healing. If you need help quitting, ask your health care provider.  Keep all follow-up visits as told by your health care provider or physical therapist. This is important. How is this prevented? To prevent a cervical sprain from happening again:  Use and maintain good posture. Make any needed adjustments to your workstation to help you use good posture.  Exercise regularly as directed by your health care provider or physical therapist.  Avoid risky activities that  may cause a cervical sprain.  Contact a health care provider if:  You have symptoms that get worse or do not get better after 2 weeks of treatment.  You have pain that gets worse or does not get better with medicine.  You develop new, unexplained symptoms.  You have sores or irritated skin on your neck from wearing your cervical collar. Get help right away if:  You have severe pain.  You develop numbness, tingling, or weakness in any part of your body.  You cannot move a part of your body (you have paralysis).  You have neck pain along with: ? Severe dizziness. ? Headache. Summary  A cervical sprain is a stretch or tear in one or more of the tough, cord-like tissues that connect bones (ligaments) in the neck.  Cervical sprains may be caused by an injury (trauma), such as from a motor vehicle accident, a fall, or sudden forward and backward whipping movement of the head and neck (whiplash injury).  Symptoms may develop right away after injury, or  they may develop over a few days.  This condition is treated by resting and icing the injured area and doing physical therapy exercises. This information is not intended to replace advice given to you by your health care provider. Make sure you discuss any questions you have with your health care provider. Document Released: 11/20/2006 Document Revised: 09/22/2015 Document Reviewed: 09/22/2015 Elsevier Interactive Patient Education  2018 [employer]  Cervical Strain and Sprain Rehab Ask your health care provider which exercises are safe for you. Do exercises exactly as told by your health care provider and adjust them as directed. It is normal to feel mild stretching, pulling, tightness, or discomfort as you do these exercises, but you should stop right away if you feel sudden pain or your pain gets worse.Do not begin these exercises until told by your health care provider. Stretching and range of motion exercises These exercises warm up your muscles and joints and improve the movement and flexibility of your neck. These exercises also help to relieve pain, numbness, and tingling. Exercise A: Cervical side bend  1. Using good posture, sit on a stable chair or stand up. 2. Without moving your shoulders, slowly tilt your left / right ear to your shoulder until you feel a stretch in your neck muscles. You should be looking straight ahead. 3. Hold for __________ seconds. 4. Repeat with the other side of your neck. Repeat __________ times. Complete this exercise __________ times a day. Exercise B: Cervical rotation  1. Using good posture, sit on a stable chair or stand up. 2. Slowly turn your head to the side as if you are looking over your left / right shoulder. ? Keep your eyes level with the ground. ? Stop when you feel a stretch along the side and the back of your neck. 3. Hold for __________ seconds. 4. Repeat this by turning to your other side. Repeat __________ times. Complete this  exercise __________ times a day. Exercise C: Thoracic extension and pectoral stretch 1. Roll a towel or a small blanket so it is about 4 inches (10 cm) in diameter. 2. Lie down on your back on a firm surface. 3. Put the towel lengthwise, under your spine in the middle of your back. It should not be not under your shoulder blades. The towel should line up with your spine from your middle back to your lower back. 4. Put your hands behind your head and let your elbows  fall out to your sides. 5. Hold for __________ seconds. Repeat __________ times. Complete this exercise __________ times a day. Strengthening exercises These exercises build strength and endurance in your neck. Endurance is the ability to use your muscles for a long time, even after your muscles get tired. Exercise D: Upper cervical flexion, isometric 1. Lie on your back with a thin pillow behind your head and a small rolled-up towel under your neck. 2. Gently tuck your chin toward your chest and nod your head down to look toward your feet. Do not lift your head off the pillow. 3. Hold for __________ seconds. 4. Release the tension slowly. Relax your neck muscles completely before you repeat this exercise. Repeat __________ times. Complete this exercise __________ times a day. Exercise E: Cervical extension, isometric  1. Stand about 6 inches (15 cm) away from a wall, with your back facing the wall. 2. Place a soft object, about 6-8 inches (15-20 cm) in diameter, between the back of your head and the wall. A soft object could be a small pillow, a ball, or a folded towel. 3. Gently tilt your head back and press into the soft object. Keep your jaw and forehead relaxed. 4. Hold for __________ seconds. 5. Release the tension slowly. Relax your neck muscles completely before you repeat this exercise. Repeat __________ times. Complete this exercise __________ times a day. Posture and body mechanics  Body mechanics refers to the  movements and positions of your body while you do your daily activities. Posture is part of body mechanics. Good posture and healthy body mechanics can help to relieve stress in your body's tissues and joints. Good posture means that your spine is in its natural S-curve position (your spine is neutral), your shoulders are pulled back slightly, and your head is not tipped forward. The following are general guidelines for applying improved posture and body mechanics to your everyday activities. Standing  When standing, keep your spine neutral and keep your feet about hip-width apart. Keep a slight bend in your knees. Your ears, shoulders, and hips should line up.  When you do a task in which you stand in one place for a long time, place one foot up on a stable object that is 2-4 inches (5-10 cm) high, such as a footstool. This helps keep your spine neutral. Sitting   When sitting, keep your spine neutral and your keep feet flat on the floor. Use a footrest, if necessary, and keep your thighs parallel to the floor. Avoid rounding your shoulders, and avoid tilting your head forward.  When working at a desk or a computer, keep your desk at a height where your hands are slightly lower than your elbows. Slide your chair under your desk so you are close enough to maintain good posture.  When working at a computer, place your monitor at a height where you are looking straight ahead and you do not have to tilt your head forward or downward to look at the screen. Resting When lying down and resting, avoid positions that are most painful for you. Try to support your neck in a neutral position. You can use a contour pillow or a small rolled-up towel. Your pillow should support your neck but not push on it. This information is not intended to replace advice given to you by your health care provider. Make sure you discuss any questions you have with your health care provider. Document Released: 01/23/2005 Document  Revised: 09/30/2015 Document Reviewed: 12/30/2014 Elsevier Interactive  Patient Education  2018 [employer]  

## 2017-09-25 NOTE — Progress Notes (Signed)
Subjective:    Patient ID: Veronica Beasley, female    DOB: December 29, 1990, 27 y.o.   MRN: [id number]  The patient is a 27 year old female who presents today with complaints of neck pain.  The patient states yesterday when she was waking up she turned her neck and outstretched her arms and felt a pull in her left neck.  The patient states since that time she has had worsening pain.  Patient states the pain starts around her left shoulder blade immediately under the base of the neck and does radiate upward into her left neck.  The patient states that she does not have any muscle weakness, numbness, or tingling, in her distal extremities.  Patient states she has had a history of back problems where she has the same feeling that she had at that time.  Patient states she has tried ice, and taking ibuprofen with little to no relief.  Neck Pain   This is a new problem. The current episode started yesterday. Episode frequency: depends on movement. The problem has been unchanged. The pain is present in the left side. The quality of the pain is described as aching. The symptoms are aggravated by twisting and bending. The pain is same all the time. Stiffness is present all day. Associated symptoms include numbness. She has tried NSAIDs and heat for the symptoms. The treatment provided no relief.   Reviewed the patient's past medical history, current medications, and allergies.   Review of Systems  Constitutional: Negative.   HENT: Negative.   Respiratory: Negative.   Cardiovascular: Negative.   Musculoskeletal: Positive for neck pain and neck stiffness.  Neurological: Positive for numbness.       Objective:   Physical Exam  Constitutional: She is oriented to person, place, and time. She appears well-developed and well-nourished. She appears distressed (due to neck pain).  HENT:  Head: Normocephalic.  Right Ear: External ear normal.  Left Ear: External ear normal.  Eyes: Pupils are equal, round, and  reactive to light. EOM are normal.  Neck: Neck supple. No tracheal deviation present. No thyromegaly present.  Cardiovascular: Normal rate, regular rhythm and normal heart sounds.  Pulmonary/Chest: Effort normal and breath sounds normal.  Abdominal: Soft. Bowel sounds are normal.  Musculoskeletal: She exhibits no deformity. Tenderness: point tenderness to left neck, pain with rotation to left side and flexion.  Neurological: She is alert and oriented to person, place, and time.  Skin: Skin is warm and dry.       Assessment & Plan:  Cervicalgian and Cervical Neck Sprain  Exam findings, diagnosis etiology and medication use and indications reviewed with patient. Follow- Up and discharge instructions provided. No emergent/urgent issues found on exam. Patient verbalized understanding of information provided and agrees with plan of care (POC), all questions answered.  1. Cervicalgia  - cyclobenzaprine (FLEXERIL) 10 MG tablet; Take 1 tablet (10 mg total) by mouth 3 (three) times daily as needed for up to 10 days for muscle spasms.  Dispense: 30 tablet; Refill: 0 -Medications as prescribed.  Ibuprofen 800mg  every 8 hours for 3 days.  Take with food and water. -Use muscle relaxer as needed. -Apply ice to the neck for 48 hours, then switch to warm moist heat for 48 hours. -Perform stretching exercises provided. -May use over-the-counter lidocaine patch for neck pain. -Sleep in a position were cervical spine is straight. -Follow up in the ER if you develop numbness, tingling or weakness in the upper extremities.  -Follow up with PCP if  symptoms do not improve.  2. Cervical sprain, initial encounter  - cyclobenzaprine (FLEXERIL) 10 MG tablet; Take 1 tablet (10 mg total) by mouth 3 (three) times daily as needed for up to 10 days for muscle spasms.  Dispense: 30 tablet; Refill: 0 -Medications as prescribed.  Ibuprofen 800mg  every 8 hours for 3 days.  Take with food and water. -Use muscle relaxer  as needed. -Apply ice to the neck for 48 hours, then switch to warm moist heat for 48 hours. -Perform stretching exercises provided. -May use over-the-counter lidocaine patch for neck pain. -Sleep in a position were cervical spine is straight. -Follow up in the ER if you develop numbness, tingling or weakness in the upper extremities.  -Follow up with PCP if symptoms do not improve.

## 2017-09-27 ENCOUNTER — Telehealth: Payer: Self-pay

## 2017-09-27 NOTE — Telephone Encounter (Signed)
I left a message asking the patient asking to call us back if she has any questions or concerns. 

## 2017-11-20 DIAGNOSIS — Z01419 Encounter for gynecological examination (general) (routine) without abnormal findings: Secondary | ICD-10-CM | POA: Diagnosis not present

## 2017-11-20 DIAGNOSIS — Z6820 Body mass index (BMI) 20.0-20.9, adult: Secondary | ICD-10-CM | POA: Diagnosis not present

## 2017-11-20 MED FILL — NORETHIN-ESTRAD-FERR 1-0.02: 1-20 | 84 days supply | Qty: 84 | Fill #0

## 2018-01-24 ENCOUNTER — Telehealth: Payer: 59 | Admitting: Family Medicine

## 2018-01-24 DIAGNOSIS — B9789 Other viral agents as the cause of diseases classified elsewhere: Secondary | ICD-10-CM

## 2018-01-24 DIAGNOSIS — J019 Acute sinusitis, unspecified: Secondary | ICD-10-CM

## 2018-01-24 MED ORDER — AZELASTINE HCL 0.1 % NA SOLN: 1.0000 | Freq: Two times a day (BID) | NASAL | 0 refills | Status: DC

## 2018-01-24 NOTE — Progress Notes (Signed)
We are sorry that you are not feeling well.  Here is how we plan to help!  Based on what you have shared with me it looks like you have sinusitis.  Sinusitis is inflammation and infection in the sinus cavities of the head.  Based on your presentation I believe you most likely have Acute Viral Sinusitis.This is an infection most likely caused by a virus. There is not specific treatment for viral sinusitis other than to help you with the symptoms until the infection runs its course.  You may use an oral decongestant such as Mucinex D or if you have glaucoma or high blood pressure use plain Mucinex. Saline nasal spray help and can safely be used as often as needed for congestion, I have prescribed: Azelastine nasal spray 2 sprays in each nostril twice a day   The medications that you are currently using are great to treat this condition. Most sinus infections that have persisted for less than 7-10 days without fever are viral in nature and can be treated with decongestants and supportive care. If you feel you may need more treatment than this or what was provided today please seek face to face care. Thanks.@  Some authorities believe that zinc sprays or the use of Echinacea may shorten the course of your symptoms.  Sinus infections are not as easily transmitted as other respiratory infection, however we still recommend that you avoid close contact with loved ones, especially the very young and elderly.  Remember to wash your hands thoroughly throughout the day as this is the number one way to prevent the spread of infection!  Home Care:  Only take medications as instructed by your medical team.  Do not take these medications with alcohol.  A steam or ultrasonic humidifier can help congestion.  You can place a towel over your head and breathe in the steam from hot water coming from a faucet.  Avoid close contacts especially the very young and the elderly.  Cover your mouth when you cough or  sneeze.  Always remember to wash your hands.  Get Help Right Away If:  You develop worsening fever or sinus pain.  You develop a severe head ache or visual changes.  Your symptoms persist after you have completed your treatment plan.  Make sure you  Understand these instructions.  Will watch your condition.  Will get help right away if you are not doing well or get worse.  Your e-visit answers were reviewed by a board certified advanced clinical practitioner to complete your personal care plan.  Depending on the condition, your plan could have included both over the counter or prescription medications.  If there is a problem please reply  once you have received a response from your provider.  Your safety is important to us.  If you have drug allergies check your prescription carefully.    You can use MyChart to ask questions about today's visit, request a non-urgent call back, or ask for a work or school excuse for 24 hours related to this e-Visit. If it has been greater than 24 hours you will need to follow up with your provider, or enter a new e-Visit to address those concerns.  You will get an e-mail in the next two days asking about your experience.  I hope that your e-visit has been valuable and will speed your recovery. Thank you for using e-visits.

## 2018-01-28 ENCOUNTER — Telehealth: Payer: 59 | Admitting: Family Medicine

## 2018-01-28 DIAGNOSIS — B373 Candidiasis of vulva and vagina: Secondary | ICD-10-CM | POA: Diagnosis not present

## 2018-01-28 DIAGNOSIS — B3731 Acute candidiasis of vulva and vagina: Secondary | ICD-10-CM

## 2018-01-28 MED ORDER — FLUCONAZOLE 150 MG PO TABS: 150.0000 mg | ORAL_TABLET | Freq: Once | ORAL | 0 refills | Status: AC

## 2018-01-28 MED FILL — FLUCONAZOLE 150 MG TABS: 150 | 1 days supply | Qty: 1 | Fill #0

## 2018-01-28 NOTE — Progress Notes (Signed)
We are sorry that you are not feeling well. Here is how we plan to help! Based on what you shared with me it looks like you: May have a yeast vaginosis  Vaginosis is an inflammation of the vagina that can result in discharge, itching and pain. The cause is usually a change in the normal balance of vaginal bacteria or an infection. Vaginosis can also result from reduced estrogen levels after menopause.  The most common causes of vaginosis are:   Bacterial vaginosis which results from an overgrowth of one on several organisms that are normally present in your vagina.   Yeast infections which are caused by a naturally occurring fungus called candida.   Vaginal atrophy (atrophic vaginosis) which results from the thinning of the vagina from reduced estrogen levels after menopause.   Trichomoniasis which is caused by a parasite and is commonly transmitted by sexual intercourse.  Factors that increase your risk of developing vaginosis include: Marland Kitchen. Medications, such as antibiotics and steroids . Uncontrolled diabetes . Use of hygiene products such as bubble bath, vaginal spray or vaginal deodorant . Douching . Wearing damp or tight-fitting clothing . Using an intrauterine device (IUD) for birth control . Hormonal changes, such as those associated with pregnancy, birth control pills or menopause . Sexual activity . Having a sexually transmitted infection  Your treatment plan is A single Diflucan (fluconazole) 150mg  tablet once.  I have electronically sent this prescription into the pharmacy that you have chosen.   If your symptoms are not improved you may need evaluation for other causes of your vaginal symptoms.  Be sure to take all of the medication as directed. Stop taking any medication if you develop a rash, tongue swelling or shortness of breath. Mothers who are breast feeding should consider pumping and discarding their breast milk while on these antibiotics. However, there is no consensus  that infant exposure at these doses would be harmful.  Remember that medication creams can weaken latex condoms. Marland Kitchen.   HOME CARE:  Good hygiene may prevent some types of vaginosis from recurring and may relieve some symptoms:  . Avoid baths, hot tubs and whirlpool spas. Rinse soap from your outer genital area after a shower, and dry the area well to prevent irritation. Don't use scented or harsh soaps, such as those with deodorant or antibacterial action. Marland Kitchen. Avoid irritants. These include scented tampons and pads. . Wipe from front to back after using the toilet. Doing so avoids spreading fecal bacteria to your vagina.  Other things that may help prevent vaginosis include:  Marland Kitchen. Don't douche. Your vagina doesn't require cleansing other than normal bathing. Repetitive douching disrupts the normal organisms that reside in the vagina and can actually increase your risk of vaginal infection. Douching won't clear up a vaginal infection. . Use a latex condom. Both female and female latex condoms may help you avoid infections spread by sexual contact. . Wear cotton underwear. Also wear pantyhose with a cotton crotch. If you feel comfortable without it, skip wearing underwear to bed. Yeast thrives in [employer] Hot[employer]ist environments Your symptoms should improve in the next day or two.  GET HELP RIGHT AWAY IF:  . You have pain in your lower abdomen ( pelvic area or over your ovaries) . You develop nausea or vomiting . You develop a fever . Your discharge changes or worsens . You have persistent pain with intercourse . You develop shortness of breath, a rapid pulse, or you faint.  These symptoms could be signs of  problems or infections that need to be evaluated by a medical provider now.  MAKE SURE YOU    Understand these instructions.  Will watch your condition.  Will get help right away if you are not doing well or get worse.  Your e-visit answers were reviewed by a board certified advanced clinical  practitioner to complete your personal care plan. Depending upon the condition, your plan could have included both over the counter or prescription medications. Please review your pharmacy choice to make sure that you have choses a pharmacy that is open for you to pick up any needed prescription, Your safety is important to us. If you have drug allergies check your prescription carefully.   You can use MyChart to ask questions about today's visit, request a non-urgent call back, or ask for a work or school excuse for 24 hours related to this e-Visit. If it has been greater than 24 hours you will need to follow up with your provider, or enter a new e-Visit to address those concerns. You will get a MyChart message within the next two days asking about your experience. I hope that your e-visit has been valuable and will speed your recovery.

## 2018-02-08 MED FILL — LARIN FE 1-20 TABLET: 1-20 | 84 days supply | Qty: 84 | Fill #1

## 2018-04-05 ENCOUNTER — Telehealth: Payer: 59 | Admitting: Family

## 2018-04-05 DIAGNOSIS — R112 Nausea with vomiting, unspecified: Secondary | ICD-10-CM | POA: Diagnosis not present

## 2018-04-05 MED ORDER — ONDANSETRON 4 MG PO TBDP: 4.0000 mg | ORAL_TABLET | Freq: Three times a day (TID) | ORAL | 0 refills | Status: DC | PRN

## 2018-04-05 NOTE — Progress Notes (Signed)

## 2018-04-23 MED FILL — NORETHIN-ESTRAD-FERR 1-0.02: 1-20 | 84 days supply | Qty: 84 | Fill #2

## 2018-05-16 ENCOUNTER — Telehealth: Payer: 59 | Admitting: Family

## 2018-05-16 DIAGNOSIS — K2100 Gastro-esophageal reflux disease with esophagitis, without bleeding: Secondary | ICD-10-CM

## 2018-05-16 MED ORDER — ESOMEPRAZOLE MAGNESIUM 40 MG PO CPDR: 40.0000 mg | DELAYED_RELEASE_CAPSULE | Freq: Every day | ORAL | 3 refills | Status: DC

## 2018-05-16 NOTE — Progress Notes (Signed)
We are sorry that you are not feeling well.  Here is how we plan to help!  Based on what you shared with me it looks like you most likely have Gastroesophageal Reflux Disease (GERD)  Gastroesophageal reflux disease (GERD) happens when acid from your stomach flows up into the esophagus.  When acid comes in contact with the esophagus, the acid causes sorenss (inflammation) in the esophagus.  Over time, GERD may create small holes (ulcers) in the lining of the esophagus.  I have prescribed Nexium 40 mg once daily.   Your symptoms should improve in the next day or two.  You can use antacids as needed until symptoms resolve.  Call us if your heartburn worsens, you have trouble swallowing, weight loss, spitting up blood or recurrent vomiting.  Home Care:  May include lifestyle changes such as weight loss, quitting smoking and alcohol consumption  Avoid foods and drinks that make your symptoms worse, such as:  Caffeine or alcoholic drinks  Chocolate  Peppermint or mint flavorings  Garlic and onions  Spicy foods  Citrus fruits, such as oranges, lemons, or limes  Tomato-based foods such as sauce, chili, salsa and pizza  Fried and fatty foods  Avoid lying down for 3 hours prior to your bedtime or prior to taking a nap  Eat small, frequent meals instead of a large meals  Wear loose-fitting clothing.  Do not wear anything tight around your waist that causes pressure on your stomach.  Raise the head of your bed 6 to 8 inches with wood blocks to help you sleep.  Extra pillows will not help.  Seek Help Right Away If:  You have pain in your arms, neck, jaw, teeth or back  Your pain increases or changes in intensity or duration  You develop nausea, vomiting or sweating (diaphoresis)  You develop shortness of breath or you faint  Your vomit is green, yellow, black or looks like coffee grounds or blood  Your stool is red, bloody or black  These symptoms could be signs of other  problems, such as heart disease, gastric bleeding or esophageal bleeding.  Make sure you :  Understand these instructions.  Will watch your condition.  Will get help right away if you are not doing well or get worse.  Your e-visit answers were reviewed by a board certified advanced clinical practitioner to complete your personal care plan.  Depending on the condition, your plan could have included both over the counter or prescription medications.  If there is a problem please reply  once you have received a response from your provider.  Your safety is important to [place].  If you have drug allergies check your prescription carefully.    You can use MyChart to ask questions about today's visit, request a non-urgent call back, or ask for a work or school excuse for 24 hours related to this e-Visit. If it has been greater than 24 hours you will need to follow up with your provider, or enter a new e-Visit to address those concerns.  You will get an e-mail in the next two days asking about your experience.  I hope that your e-visit has been valuable and will speed your recovery. Thank you for using e-visits.

## 2018-05-17 MED FILL — ESOMEPRAZOLE MAG DR 40 MG C: 40 | 30 days supply | Qty: 30 | Fill #0

## 2018-07-15 ENCOUNTER — Ambulatory Visit
Admission: EM | Admit: 2018-07-15 | Discharge: 2018-07-15 | Disposition: A | Discharge status: Home / Self Care | Payer: 59 | Attending: Physician Assistant | Admitting: Physician Assistant

## 2018-07-15 ENCOUNTER — Other Ambulatory Visit: Payer: Self-pay

## 2018-07-15 ENCOUNTER — Encounter: Payer: Self-pay | Admitting: Emergency Medicine

## 2018-07-15 DIAGNOSIS — L739 Follicular disorder, unspecified: Secondary | ICD-10-CM

## 2018-07-15 MED ORDER — CEPHALEXIN 500 MG PO CAPS: 500.0000 mg | ORAL_CAPSULE | Freq: Four times a day (QID) | ORAL | 0 refills | Status: DC

## 2018-07-15 MED ORDER — FLUCONAZOLE 150 MG PO TABS: ORAL_TABLET | ORAL | 0 refills | Status: DC

## 2018-07-15 NOTE — ED Notes (Signed)
Patient able to ambulate independently  

## 2018-07-15 NOTE — Discharge Instructions (Signed)
Start keflex as directed. Diflucan to prevent yeast. Warm compress. Monitor for spreading redness, warmth, fever. As discussed, if needing drainage, follow up with PCP for guided US drainage.

## 2018-07-15 NOTE — ED Provider Notes (Signed)
[facility]    CSN: [id number] Arrival date & time: 07/15/18  1423     History   Chief Complaint Chief Complaint  Patient presents with  . Abscess    HPI Veronica Beasley is a 28 y.o. female.   28 year old female comes in for 1 week history of abscess to the abdominal area. States started as an ingrown hair. In the past week has had increase swelling, pain, erythema. Denies fever, chills, night sweats. Topical medicine, warm compress with mild relief.      Past Medical History:  Diagnosis Date  . Anxiety   . Chlamydia   . Depression   . Hypoglycemia   . Hypoglycemia    pt just has a lot of protein snacks to manage this    Patient Active Problem List   Diagnosis Date Noted  . Pregnancy 11/03/2013  . Indication for care in labor or delivery 11/02/2013    Past Surgical History:  Procedure Laterality Date  . NO PAST SURGERIES      OB History    Gravida  1   Para  1   Term  1   Preterm      AB      Living  1     SAB      TAB      Ectopic      Multiple      Live Births  1            Home Medications    Prior to Admission medications   Medication Sig Start Date End Date Taking? Authorizing Provider  azelastine (ASTELIN) 0.1 % nasal spray Place 1 spray into both nostrils 2 (two) times daily. Use in each nostril as directed 01/24/18   Zachery DauerGraham, Elysa, NP  cephALEXin (KEFLEX) 500 MG capsule Take 1 capsule (500 mg total) by mouth 4 (four) times daily. 07/15/18   Cathie HoopsYu, Amy V, PA-C  esomeprazole (NEXIUM) 40 MG capsule Take 1 capsule (40 mg total) by mouth daily. 05/16/18   Eulis FosterWebb, Padonda B, FNP  fluconazole (DIFLUCAN) 150 MG tablet Take second dose 72 hours later if symptoms still persists. 07/15/18   Cathie HoopsYu, Amy V, PA-C  ibuprofen (ADVIL,MOTRIN) 600 MG tablet Take 1 tablet (600 mg total) by mouth every 6 (six) hours. 11/04/13   Julio Sicksurtis, Carol, NP  naproxen sodium (ALEVE) 220 MG tablet Take 220 mg by mouth.    [provider]   norethindrone-ethinyl estradiol (JUNEL FE,GILDESS FE,LOESTRIN FE) 1-20 MG-MCG tablet  09/18/17   [provider]  ondansetron (ZOFRAN ODT) 4 MG disintegrating tablet Take 1 tablet (4 mg total) by mouth every 8 (eight) hours as needed for nausea or vomiting. 04/05/18   Worthy RancherWebb, Padonda B, FNP  ondansetron (ZOFRAN) 4 MG tablet Take 1 tablet (4 mg total) by mouth every 8 (eight) hours as needed for nausea or vomiting. Patient not taking: Reported on 09/25/2017 02/24/16   Waldon MerlMartin, William C, PA-C    Family History Family History  Problem Relation Age of Onset  . Hypertension Mother   . Other Neg Hx     Social History Social History   Tobacco Use  . Smoking status: Former Games developermoker  . Smokeless tobacco: Never Used  . Tobacco comment: briefly at age 28  Substance Use Topics  . Alcohol use: No    Comment: occ  . Drug use: No     Allergies   Food   Review of Systems Review of Systems  Reason unable  to perform ROS: See HPI as above.     Physical Exam Triage Vital Signs ED Triage Vitals  Enc Vitals Group     BP 07/15/18 1433 124/80     Pulse Rate 07/15/18 1433 64     Resp 07/15/18 1433 18     Temp 07/15/18 1433 98.5 F (36.9 C)     Temp Source 07/15/18 1433 Oral     SpO2 07/15/18 1433 99 %     Weight --      Height --      Head Circumference --      Peak Flow --      Pain Score 07/15/18 1434 7     Pain Loc --      Pain Edu? --      Excl. in [place]? --    No data found.  Updated Vital Signs BP 124/80 (BP Location: Left Arm)   Pulse 64   Temp 98.5 F (36.9 C) (Oral)   Resp 18   SpO2 99%   Physical Exam Constitutional:      General: She is not in acute distress.    Appearance: She is well-developed. She is not diaphoretic.  HENT:     Head: Normocephalic and atraumatic.  Eyes:     Conjunctiva/sclera: Conjunctivae normal.     Pupils: Pupils are equal, round, and reactive to light.  Abdominal:     General: Bowel sounds are normal.     Palpations: Abdomen is  soft.     Tenderness: There is no abdominal tenderness. There is no right CVA tenderness, left CVA tenderness, guarding or rebound.     Hernia: No hernia is present.     Comments: 1cm induration/swelling. No fluctuance felt. Tender to palpation.   Neurological:     Mental Status: She is alert and oriented to person, place, and time.      UC Treatments / Results  Labs (all labs ordered are listed, but only abnormal results are displayed) Labs Reviewed - No data to display  EKG None  Radiology No results found.  Procedures Procedures (including critical care time)  Medications Ordered in UC Medications - No data to display  Initial Impression / Assessment and Plan / UC Course  I have reviewed the triage vital signs and the nursing notes.  Pertinent labs & imaging results that were available during my care of the patient were reviewed by me and considered in my medical decision making (see chart for details).    Start keflex as directed. Warm compress. Return precautions given. Patient expresses understanding and agrees to plan.  Final Clinical Impressions(s) / UC Diagnoses   Final diagnoses:  Folliculitis    ED Prescriptions    Medication Sig Dispense Auth. Provider   cephALEXin (KEFLEX) 500 MG capsule Take 1 capsule (500 mg total) by mouth 4 (four) times daily. 28 capsule Yu, Amy V, PA-C   fluconazole (DIFLUCAN) 150 MG tablet Take second dose 72 hours later if symptoms still persists. 2 tablet Tobin Chad, PA-C 07/15/18 1456

## 2018-07-15 NOTE — ED Triage Notes (Signed)
Pt presents to [facility] for assessment of abscess to abdominal area x 1 week

## 2018-07-29 MED FILL — ESOMEPRAZOLE MAG DR 40 MG C: 40 | 30 days supply | Qty: 30 | Fill #1

## 2018-07-29 MED FILL — BLISOVI FE 1/20 1-20 MG-MCG: 1-20 | 84 days supply | Qty: 84 | Fill #3

## 2018-10-15 MED FILL — BLISOVI FE 1/20 1-20 MG-MCG: 1-20 | 28 days supply | Qty: 28 | Fill #4

## 2018-10-22 DIAGNOSIS — Z309 Encounter for contraceptive management, unspecified: Secondary | ICD-10-CM | POA: Diagnosis not present

## 2018-11-08 ENCOUNTER — Ambulatory Visit (INDEPENDENT_AMBULATORY_CARE_PROVIDER_SITE_OTHER)
Admission: RE | Admit: 2018-11-08 | Discharge: 2018-11-08 | Disposition: A | Discharge status: Home / Self Care | Payer: 59 | Source: Ambulatory Visit

## 2018-11-08 DIAGNOSIS — N760 Acute vaginitis: Secondary | ICD-10-CM | POA: Diagnosis not present

## 2018-11-08 MED ORDER — FLUCONAZOLE 150 MG PO TABS: ORAL_TABLET | ORAL | 0 refills | Status: DC

## 2018-11-08 NOTE — Discharge Instructions (Signed)
Treating for yeast infection Take as prescribed Follow up as needed for continued or worsening symptoms

## 2018-11-08 NOTE — ED Provider Notes (Signed)
Virtual Visit via Video Note:  Veronica Beasley  initiated request for Telemedicine visit with [facility] team. I connected with Veronica Beasley  on 11/08/2018 at 3:02 PM  for a synchronized telemedicine visit using a video enabled HIPPA compliant telemedicine application. I verified that I am speaking with Veronica Beasley  using two identifiers. Veronica July, NP  was physically located in a [facility]gent care site and Veronica Beasley was located at a different location.   The limitations of evaluation and management by telemedicine as well as the availability of in-person appointments were discussed. Patient was informed that she  may incur a bill ( including co-pay) for this virtual visit encounter. Veronica Beasley  expressed understanding and gave verbal consent to proceed with virtual visit.     History of Present Illness:Veronica Beasley  is a 28 y.o. female presents with vaginal discharge and itching. This has been constant x 2 days. Hx of same with recurrent vaginal yeast infection. No dysuria, hematuria, back pain, abdominal pain, fever. OTC meds not working.   Past Medical History:  Diagnosis Date  . Anxiety   . Chlamydia   . Depression   . Hypoglycemia   . Hypoglycemia    pt just has a lot of protein snacks to manage this    Allergies  Allergen Reactions  . Food Hives and Itching    Ceasar Dressing        Observations/Objective:VITALS: Per patient if applicable, see vitals. GENERAL: Alert, appears well and in no acute distress. HEENT: Atraumatic, conjunctiva clear, no obvious abnormalities on inspection of external nose and ears. NECK: Normal movements of the head and neck. CARDIOPULMONARY: No increased WOB. Speaking in clear sentences. I:E ratio WNL.  MS: Moves all visible extremities without noticeable abnormality. PSYCH: Pleasant and cooperative, well-groomed. Speech normal rate and rhythm. Affect is appropriate. Insight and judgement are appropriate. Attention is  focused, linear, and appropriate.  NEURO: CN grossly intact. Oriented as arrived to appointment on time with no prompting. Moves both UE equally.  SKIN: No obvious lesions, wounds, erythema, or cyanosis noted on face or hands.     Assessment and Plan: Treating for yeast vaginitis with fluconazole.    Follow Up Instructions: Follow up as needed for continued or worsening symptoms     I discussed the assessment and treatment plan with the patient. The patient was provided an opportunity to ask questions and all were answered. The patient agreed with the plan and demonstrated an understanding of the instructions.   The patient was advised to call back or seek an in-person evaluation if the symptoms worsen or if the condition fails to improve as anticipated.     Veronica July, NP  11/08/2018 3:02 PM         Veronica July, NP 11/10/18 2055

## 2018-11-14 DIAGNOSIS — Z30430 Encounter for insertion of intrauterine contraceptive device: Secondary | ICD-10-CM | POA: Diagnosis not present

## 2018-11-26 DIAGNOSIS — Z6821 Body mass index (BMI) 21.0-21.9, adult: Secondary | ICD-10-CM | POA: Diagnosis not present

## 2018-11-26 DIAGNOSIS — N879 Dysplasia of cervix uteri, unspecified: Secondary | ICD-10-CM | POA: Diagnosis not present

## 2018-11-26 DIAGNOSIS — Z01419 Encounter for gynecological examination (general) (routine) without abnormal findings: Secondary | ICD-10-CM | POA: Diagnosis not present

## 2018-11-27 DIAGNOSIS — Z01419 Encounter for gynecological examination (general) (routine) without abnormal findings: Secondary | ICD-10-CM | POA: Diagnosis not present

## 2018-12-10 DIAGNOSIS — F419 Anxiety disorder, unspecified: Secondary | ICD-10-CM | POA: Diagnosis not present

## 2018-12-24 DIAGNOSIS — F419 Anxiety disorder, unspecified: Secondary | ICD-10-CM | POA: Diagnosis not present

## 2018-12-31 DIAGNOSIS — F419 Anxiety disorder, unspecified: Secondary | ICD-10-CM | POA: Diagnosis not present

## 2019-01-08 DIAGNOSIS — Z309 Encounter for contraceptive management, unspecified: Secondary | ICD-10-CM | POA: Diagnosis not present

## 2019-01-08 DIAGNOSIS — Z30432 Encounter for removal of intrauterine contraceptive device: Secondary | ICD-10-CM | POA: Diagnosis not present

## 2019-01-08 MED FILL — BLISOVI FE 1/20 1-20 MG-MCG: 1-20 | 28 days supply | Qty: 28 | Fill #0

## 2019-01-17 DIAGNOSIS — F419 Anxiety disorder, unspecified: Secondary | ICD-10-CM | POA: Diagnosis not present

## 2019-01-27 ENCOUNTER — Ambulatory Visit (INDEPENDENT_AMBULATORY_CARE_PROVIDER_SITE_OTHER)
Admission: RE | Admit: 2019-01-27 | Discharge: 2019-01-27 | Disposition: A | Discharge status: Home / Self Care | Payer: 59 | Source: Ambulatory Visit

## 2019-01-27 DIAGNOSIS — L739 Follicular disorder, unspecified: Secondary | ICD-10-CM | POA: Diagnosis not present

## 2019-01-27 MED ORDER — FLUCONAZOLE 150 MG PO TABS: 150.0000 mg | ORAL_TABLET | Freq: Every day | ORAL | 0 refills | Status: DC

## 2019-01-27 MED ORDER — CEPHALEXIN 500 MG PO CAPS: 500.0000 mg | ORAL_CAPSULE | Freq: Four times a day (QID) | ORAL | 0 refills | Status: DC

## 2019-01-27 NOTE — ED Provider Notes (Signed)
Virtual Visit via Video Note:  Veronica Beasley  initiated request for Telemedicine visit with [facility] team. I connected with Veronica Beasley  on 01/27/2019 at 11:05 AM  for a synchronized telemedicine visit using a video enabled HIPPA compliant telemedicine application. I verified that I am speaking with Veronica Beasley  using two identifiers. Orvan July, NP  was physically located in a [facility] Urgent care site and Charlesia Canaday was located at a different location.   The limitations of evaluation and management by telemedicine as well as the availability of in-person appointments were discussed. Patient was informed that she  may incur a bill ( including co-pay) for this virtual visit encounter. Veronica Beasley  expressed understanding and gave verbal consent to proceed with virtual visit.     History of Present Illness:Veronica Beasley  is a 28 y.o. female presents with abscess to pubic area. Symptoms  consistent with follicultiits that she had previously months ago. Reporting this abscess to be about quarter sized and hard. No drainage. Red around the area. She has been doing the warm compresses. No fever.   Past Medical History:  Diagnosis Date   Anxiety    Chlamydia    Depression    Hypoglycemia    Hypoglycemia    pt just has a lot of protein snacks to manage this    Allergies  Allergen Reactions   Food Hives and Itching    Ceasar Dressing        Observations/Objective: VITALS: Per patient if applicable, see vitals. GENERAL: Alert, appears well and in no acute distress. HEENT: Atraumatic, conjunctiva clear, no obvious abnormalities on inspection of external nose and ears. NECK: Normal movements of the head and neck. CARDIOPULMONARY: No increased WOB. Speaking in clear sentences. I:E ratio WNL.  MS: Moves all visible extremities without noticeable abnormality. PSYCH: Pleasant and cooperative, well-groomed. Speech normal rate and rhythm. Affect is appropriate. Insight  and judgement are appropriate. Attention is focused, linear, and appropriate.  NEURO: CN grossly intact. Oriented as arrived to appointment on time with no prompting. Moves both UE equally.  SKIN: No obvious lesions, wounds, erythema, or cyanosis noted on face or hands.    Assessment and Plan: Abscess with history of folliculitis.  Will treat with Keflex as previous which resolved her symptoms.  Continue warm compresses   Follow Up Instructions: Follow-up in person for any continued or worsening symptoms    I discussed the assessment and treatment plan with the patient. The patient was provided an opportunity to ask questions and all were answered. The patient agreed with the plan and demonstrated an understanding of the instructions.   The patient was advised to call back or seek an in-person evaluation if the symptoms worsen or if the condition fails to improve as anticipated.   Orvan July, NP  01/27/2019 11:05 AM         Orvan July, NP 01/27/19 1545

## 2019-01-27 NOTE — Discharge Instructions (Signed)
Take the medication as prescribed °Follow up as needed for continued or worsening symptoms ° °

## 2019-02-03 MED FILL — BLISOVI FE 1/20 1-20 MG-MCG: 1-20 | 28 days supply | Qty: 28 | Fill #0

## 2019-02-11 DIAGNOSIS — F419 Anxiety disorder, unspecified: Secondary | ICD-10-CM | POA: Diagnosis not present

## 2019-02-18 DIAGNOSIS — F419 Anxiety disorder, unspecified: Secondary | ICD-10-CM | POA: Diagnosis not present

## 2019-03-07 DIAGNOSIS — F419 Anxiety disorder, unspecified: Secondary | ICD-10-CM | POA: Diagnosis not present

## 2019-03-07 MED FILL — BLISOVI FE 1/20 1-20 MG-MCG: 1-20 | 84 days supply | Qty: 84 | Fill #1

## 2019-03-11 DIAGNOSIS — F419 Anxiety disorder, unspecified: Secondary | ICD-10-CM | POA: Diagnosis not present

## 2019-04-08 DIAGNOSIS — F419 Anxiety disorder, unspecified: Secondary | ICD-10-CM | POA: Diagnosis not present

## 2019-04-15 DIAGNOSIS — F419 Anxiety disorder, unspecified: Secondary | ICD-10-CM | POA: Diagnosis not present

## 2019-05-27 MED FILL — BLISOVI FE 1/20 1-20 MG-MCG: 1-20 | 84 days supply | Qty: 84 | Fill #2

## 2019-07-02 DIAGNOSIS — F419 Anxiety disorder, unspecified: Secondary | ICD-10-CM | POA: Diagnosis not present

## 2019-07-28 DIAGNOSIS — F419 Anxiety disorder, unspecified: Secondary | ICD-10-CM | POA: Diagnosis not present

## 2019-08-06 DIAGNOSIS — F419 Anxiety disorder, unspecified: Secondary | ICD-10-CM | POA: Diagnosis not present

## 2019-09-01 DIAGNOSIS — Z23 Encounter for immunization: Secondary | ICD-10-CM | POA: Diagnosis not present

## 2019-09-22 DIAGNOSIS — Z23 Encounter for immunization: Secondary | ICD-10-CM | POA: Diagnosis not present

## 2019-09-29 MED FILL — BLISOVI FE 1/20 1-20 MG-MCG: 1-20 | 84 days supply | Qty: 84 | Fill #3

## 2019-10-02 DIAGNOSIS — F419 Anxiety disorder, unspecified: Secondary | ICD-10-CM | POA: Diagnosis not present

## 2019-10-10 MED FILL — BLISOVI FE 1/20 1-20 MG-MCG: 1-20 | 84 days supply | Qty: 84 | Fill #3

## 2019-11-27 ENCOUNTER — Other Ambulatory Visit (HOSPITAL_COMMUNITY): Payer: Self-pay | Admitting: Obstetrics and Gynecology

## 2019-11-27 DIAGNOSIS — Z309 Encounter for contraceptive management, unspecified: Secondary | ICD-10-CM | POA: Diagnosis not present

## 2019-11-27 DIAGNOSIS — Z113 Encounter for screening for infections with a predominantly sexual mode of transmission: Secondary | ICD-10-CM | POA: Diagnosis not present

## 2019-11-27 DIAGNOSIS — Z6820 Body mass index (BMI) 20.0-20.9, adult: Secondary | ICD-10-CM | POA: Diagnosis not present

## 2019-11-27 DIAGNOSIS — Z01419 Encounter for gynecological examination (general) (routine) without abnormal findings: Secondary | ICD-10-CM | POA: Diagnosis not present

## 2020-02-18 DIAGNOSIS — F419 Anxiety disorder, unspecified: Secondary | ICD-10-CM | POA: Diagnosis not present

## 2020-02-19 ENCOUNTER — Other Ambulatory Visit (HOSPITAL_COMMUNITY): Payer: Self-pay

## 2020-02-19 MED FILL — ACETAMINOPHEN/COD #3 TABLET: 300-30 | 3 days supply | Qty: 15 | Fill #0

## 2020-02-19 MED FILL — IBUPROFEN 600 MG TABLET: 600 | 5 days supply | Qty: 30 | Fill #0

## 2020-03-12 DIAGNOSIS — F419 Anxiety disorder, unspecified: Secondary | ICD-10-CM | POA: Diagnosis not present

## 2020-03-18 DIAGNOSIS — Z0000 Encounter for general adult medical examination without abnormal findings: Secondary | ICD-10-CM | POA: Diagnosis not present

## 2020-03-18 DIAGNOSIS — F419 Anxiety disorder, unspecified: Secondary | ICD-10-CM | POA: Diagnosis not present

## 2020-03-18 DIAGNOSIS — Z13220 Encounter for screening for lipoid disorders: Secondary | ICD-10-CM | POA: Diagnosis not present

## 2020-03-18 DIAGNOSIS — Z5181 Encounter for therapeutic drug level monitoring: Secondary | ICD-10-CM | POA: Diagnosis not present

## 2020-04-02 MED FILL — BLISOVI FE 1/20 1-20 MG-MCG: 1-20 | 84 days supply | Qty: 84 | Fill #0

## 2020-05-27 ENCOUNTER — Emergency Department (HOSPITAL_BASED_OUTPATIENT_CLINIC_OR_DEPARTMENT_OTHER)
Admission: EM | Admit: 2020-05-27 | Discharge: 2020-05-27 | Disposition: A | Discharge status: Home / Self Care | Payer: 59 | Attending: Emergency Medicine | Admitting: Emergency Medicine

## 2020-05-27 ENCOUNTER — Other Ambulatory Visit: Payer: Self-pay

## 2020-05-27 ENCOUNTER — Encounter (HOSPITAL_BASED_OUTPATIENT_CLINIC_OR_DEPARTMENT_OTHER): Payer: Self-pay | Admitting: Emergency Medicine

## 2020-05-27 DIAGNOSIS — H9202 Otalgia, left ear: Secondary | ICD-10-CM | POA: Diagnosis present

## 2020-05-27 DIAGNOSIS — H66002 Acute suppurative otitis media without spontaneous rupture of ear drum, left ear: Secondary | ICD-10-CM | POA: Insufficient documentation

## 2020-05-27 DIAGNOSIS — U071 COVID-19: Secondary | ICD-10-CM | POA: Insufficient documentation

## 2020-05-27 DIAGNOSIS — Z87891 Personal history of nicotine dependence: Secondary | ICD-10-CM | POA: Insufficient documentation

## 2020-05-27 DIAGNOSIS — R0981 Nasal congestion: Secondary | ICD-10-CM

## 2020-05-27 DIAGNOSIS — J0110 Acute frontal sinusitis, unspecified: Secondary | ICD-10-CM | POA: Diagnosis not present

## 2020-05-27 LAB — RESP PANEL BY RT-PCR (FLU A&B, COVID) ARPGX2
Influenza A by PCR: NEGATIVE
Influenza B by PCR: NEGATIVE
SARS Coronavirus 2 by RT PCR: POSITIVE — AB

## 2020-05-27 MED ORDER — AMOXICILLIN-POT CLAVULANATE 875-125 MG PO TABS: 1.0000 | ORAL_TABLET | Freq: Two times a day (BID) | ORAL | 0 refills | Status: AC

## 2020-05-27 MED ORDER — AMOXICILLIN-POT CLAVULANATE 875-125 MG PO TABS: 1.0000 | ORAL_TABLET | Freq: Two times a day (BID) | ORAL | 0 refills | Status: DC

## 2020-05-27 NOTE — Discharge Instructions (Signed)
Your history and exam today are consistent with sinusitis and ear infection.  We discussed treating with an antibiotic that will cover for both.  Please rest and stay hydrated and treat your fevers at home.  Please take the antibiotics.  Please follow-up on the COVID and flu swab.  If any symptoms change or worsen, please return to the nearest emergency department.  Please follow-up with your primary doctor

## 2020-05-27 NOTE — ED Triage Notes (Signed)
Pt arrives pov with c/o sinus congestion, and R ear pain with fever x 2 days. Endorse ibuprofen at 1530.

## 2020-05-27 NOTE — ED Notes (Signed)
EDP [place] made aware of Patients most recent Microbiology results. EDP to contact Patient regarding Test Results. No further orders otherwise.

## 2020-05-27 NOTE — ED Provider Notes (Signed)
[facility] EMERGENCY DEPT Provider Note   CSN: [id number] Arrival date & time: 05/27/20  1639     History Chief Complaint  Patient presents with  . Ear Pain    Veronica Beasley is a 30 y.o. female.  The history is provided by the patient and medical records. No language interpreter was used.  Otalgia Location:  Left Quality:  Aching and dull Severity:  Moderate Onset quality:  Gradual Duration:  3 days Timing:  Constant Progression:  Unchanged Chronicity:  Recurrent Relieved by:  Nothing Worsened by:  Nothing Ineffective treatments:  None tried Associated symptoms: congestion, fever, headaches and rhinorrhea   Associated symptoms: no abdominal pain, no cough, no diarrhea, no neck pain, no rash, no sore throat and no vomiting        Past Medical History:  Diagnosis Date  . Anxiety   . Chlamydia   . Depression   . Hypoglycemia   . Hypoglycemia    pt just has a lot of protein snacks to manage this    Patient Active Problem List   Diagnosis Date Noted  . Pregnancy 11/03/2013  . Indication for care in labor or delivery 11/02/2013    Past Surgical History:  Procedure Laterality Date  . NO PAST SURGERIES       OB History    Gravida  1   Para  1   Term  1   Preterm      AB      Living  1     SAB      IAB      Ectopic      Multiple      Live Births  1           Family History  Problem Relation Age of Onset  . Hypertension Mother   . Other Neg Hx     Social History   Tobacco Use  . Smoking status: Former Games developer  . Smokeless tobacco: Never Used  . Tobacco comment: briefly at age 48  Substance Use Topics  . Alcohol use: Not Currently    Comment: occ  . Drug use: No    Home Medications Prior to Admission medications   Medication Sig Start Date End Date Taking? Authorizing Provider  acetaminophen-codeine (TYLENOL #3) 300-30 MG tablet TAKE 1 TABLET BY MOUTH EVERY 4 TO 6 HOURS AS NEEDED FOR PAIN FOLLOWING TOOTH  EXTRACTION 02/19/20 08/17/20    azelastine (ASTELIN) 0.1 % nasal spray Place 1 spray into both nostrils 2 (two) times daily. Use in each nostril as directed 01/24/18   Zachery Dauer, NP  cephALEXin (KEFLEX) 500 MG capsule Take 1 capsule (500 mg total) by mouth 4 (four) times daily. 01/27/19   Dahlia Byes A, NP  fluconazole (DIFLUCAN) 150 MG tablet Take 1 tablet (150 mg total) by mouth daily. 01/27/19   Bast, Gloris Manchester A, NP  ibuprofen (ADVIL) 600 MG tablet TAKE 1 TABLET BY MOUTH EVERY 4 TO 6 HOURS AS NEEDED PAIN 02/19/20 02/18/21    ibuprofen (ADVIL,MOTRIN) 600 MG tablet Take 1 tablet (600 mg total) by mouth every 6 (six) hours. 11/04/13   Julio Sicks, NP  naproxen sodium (ALEVE) 220 MG tablet Take 220 mg by mouth.    [provider]  norethindrone-ethinyl estradiol (JUNEL FE,GILDESS FE,LOESTRIN FE) 1-20 MG-MCG tablet  09/18/17   [provider]  norethindrone-ethinyl estradiol (LOESTRIN FE) 1-20 MG-MCG tablet TAKE 1 TABLET BY MOUTH ONCE A DAY 11/27/19 11/26/20  Harold Hedge, MD  esomeprazole (NEXIUM)  40 MG capsule Take 1 capsule (40 mg total) by mouth daily. 05/16/18 01/27/19  Eulis FosterWebb, Padonda B, FNP    Allergies    Food  Review of Systems   Review of Systems  Constitutional: Positive for chills and fever. Negative for diaphoresis.  HENT: Positive for congestion, ear pain, rhinorrhea and sinus pressure. Negative for facial swelling, sneezing and sore throat.   Eyes: Negative for visual disturbance.  Respiratory: Negative for cough and shortness of breath.   Cardiovascular: Negative for chest pain.  Gastrointestinal: Negative for abdominal pain, constipation, diarrhea, nausea and vomiting.  Genitourinary: Negative for dysuria.  Musculoskeletal: Negative for back pain, neck pain and neck stiffness.  Skin: Negative for rash.  Neurological: Positive for headaches. Negative for dizziness, weakness and light-headedness.  Psychiatric/Behavioral: Negative for agitation.  All other systems  reviewed and are negative.   Physical Exam Updated Vital Signs BP 117/73 (BP Location: Left Arm)   Pulse 71   Temp 99 F (37.2 C) (Oral)   Resp 16   Ht 5\' 7"  (1.702 m)   Wt 59 kg   LMP 05/20/2020   SpO2 99%   BMI 20.36 kg/m   Physical Exam Vitals and nursing note reviewed.  Constitutional:      General: She is not in acute distress.    Appearance: She is well-developed. She is not ill-appearing, toxic-appearing or diaphoretic.  HENT:     Head: Normocephalic and atraumatic.     Right Ear: Tympanic membrane and external ear normal. No mastoid tenderness. No hemotympanum. Tympanic membrane is not perforated, erythematous or bulging.     Left Ear: External ear normal. No mastoid tenderness. No hemotympanum. Tympanic membrane is erythematous and bulging. Tympanic membrane is not perforated.     Nose: Congestion and rhinorrhea present.     Mouth/Throat:     Mouth: Mucous membranes are moist.     Pharynx: No oropharyngeal exudate or posterior oropharyngeal erythema.  Eyes:     Extraocular Movements: Extraocular movements intact.     Conjunctiva/sclera: Conjunctivae normal.     Pupils: Pupils are equal, round, and reactive to light.  Cardiovascular:     Rate and Rhythm: Normal rate and regular rhythm.     Heart sounds: No murmur heard.   Pulmonary:     Effort: Pulmonary effort is normal. No respiratory distress.     Breath sounds: Normal breath sounds. No stridor. No wheezing, rhonchi or rales.  Chest:     Chest wall: No tenderness.  Abdominal:     General: Abdomen is flat.     Palpations: Abdomen is soft.     Tenderness: There is no abdominal tenderness. There is no right CVA tenderness, left CVA tenderness, guarding or rebound.  Musculoskeletal:        General: No tenderness.     Cervical back: Neck supple. No tenderness.  Skin:    General: Skin is warm and dry.     Capillary Refill: Capillary refill takes less than 2 seconds.     Findings: No erythema.  Neurological:      Mental Status: She is alert. Mental status is at baseline.     ED Results / Procedures / Treatments   Labs (all labs ordered are listed, but only abnormal results are displayed) Labs Reviewed  RESP PANEL BY RT-PCR (FLU A&B, COVID) [id number]    EKG None  Radiology No results found.  Procedures Procedures   Medications Ordered in ED Medications - No data to display  ED Course  I have reviewed the triage vital signs and the nursing notes.  Pertinent labs & imaging results that were available during my care of the patient were reviewed by me and considered in my medical decision making (see chart for details).    MDM Rules/Calculators/A&P                          Veronica Beasley is a 30 y.o. female with a past medical history significant for anxiety and depression who presents with several days of fevers, chills, left sinus pain, and left ear pain.  She reports that she has not had any congestion, cough, neck pain, neck stiffness.  She denies nausea, vomiting, constipation, or diarrhea.  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  She has had sinus infections frequently in the past.  She reports it feels somewhat like this as well as an ear infection.  She says her highest temperature at home was 103.  She denies any trauma.  She reports her headache is moderate.  She denies any other complaints.  On exam, lungs clear and chest nontender.  Normal neck range of motion.  No rigidity.  Patient has erythematous and bulging TM on the left and normal on the right.  She had no tenderness in her mastoid area and had some pain in the sinus area on the left frontal sinus.  No nasal septal hematoma seen.  Some congestion appreciated.  Pupils symmetric and reactive normal extraocular movements.  Exam otherwise unremarkable.  Patient does have congestion.  Clinically I suspect she has otitis media and possible sinus infection.   Given her history of the same, will treat with Augmentin as it will treat both.  Patient will get a COVID test and flu test given her exposure to these in the last week.   She will follow-up on MyChart for these results.  Patient was discharged home for outpatient follow-up.   Final Clinical Impression(s) / ED Diagnoses Final diagnoses:  Acute suppurative otitis media of left ear without spontaneous rupture of tympanic membrane, recurrence not specified  Acute frontal sinusitis, recurrence not specified  Head congestion    Rx / DC Orders ED Discharge Orders         Ordered    amoxicillin-clavulanate (AUGMENTIN) 875-125 MG tablet  Every 12 hours        05/27/20 1832          Clinical Impression: 1. Acute suppurative otitis media of left ear without spontaneous rupture of tympanic membrane, recurrence not specified   2. Acute frontal sinusitis, recurrence not specified   3. Head congestion     Disposition: Discharge  Condition: Good  I have discussed the results, Dx and Tx plan with the pt(& family if present). He/she/they expressed understanding and agree(s) with the plan. Discharge instructions discussed at great length. Strict return precautions discussed and pt &/or family have verbalized understanding of the instructions. No further questions at time of discharge.    New Prescriptions   AMOXICILLIN-CLAVULANATE (AUGMENTIN) 875-125 MG TABLET    Take 1 tablet by mouth every 12 (twelve) hours for 7 days.    Follow Up: [facility] [address] [place] [address] [phone] Schedule an appointment as soon as possible for a visit    [facility] [address] [place] [address] [phone]  Alanii Ramer, Canary Brim, MD 05/27/20 [phone]

## 2020-05-28 ENCOUNTER — Telehealth: Payer: Self-pay

## 2020-05-28 NOTE — Telephone Encounter (Signed)
Called to discuss with patient about COVID-19 symptoms and the use of one of the available treatments for those with mild to moderate Covid symptoms and at a high risk of hospitalization.  Pt appears to qualify for outpatient treatment due to co-morbid conditions and/or a member of an at-risk group in accordance with the FDA Emergency Use Authorization.    Symptom onset: 05/26/20 Ear pain Vaccinated: No Booster? No Immunocompromised? No Qualifiers:   Pt. Declines any further treatment.   Esther Hardy

## 2020-06-25 ENCOUNTER — Other Ambulatory Visit (HOSPITAL_COMMUNITY): Payer: Self-pay

## 2020-06-25 MED FILL — Norethindrone Ace & Ethinyl Estradiol-FE Tab 1 MG-20 MCG: ORAL | 28 days supply | Qty: 28 | Fill #0 | Status: AC

## 2020-06-30 ENCOUNTER — Other Ambulatory Visit (HOSPITAL_COMMUNITY): Payer: Self-pay

## 2020-06-30 ENCOUNTER — Ambulatory Visit
Admission: EM | Admit: 2020-06-30 | Discharge: 2020-06-30 | Disposition: A | Discharge status: Home / Self Care | Payer: 59 | Attending: Emergency Medicine | Admitting: Emergency Medicine

## 2020-06-30 ENCOUNTER — Other Ambulatory Visit: Payer: Self-pay

## 2020-06-30 ENCOUNTER — Encounter: Payer: Self-pay | Admitting: Emergency Medicine

## 2020-06-30 DIAGNOSIS — S161XXA Strain of muscle, fascia and tendon at neck level, initial encounter: Secondary | ICD-10-CM

## 2020-06-30 DIAGNOSIS — M62838 Other muscle spasm: Secondary | ICD-10-CM | POA: Diagnosis not present

## 2020-06-30 MED ORDER — TIZANIDINE HCL 4 MG PO TABS
4.0000 mg | ORAL_TABLET | Freq: Three times a day (TID) | ORAL | 0 refills | Status: DC | PRN
  Filled 2020-06-30: qty 30, 10d supply, fill #0

## 2020-06-30 MED FILL — Ibuprofen Tab 600 MG: ORAL | 5 days supply | Qty: 30 | Fill #0 | Status: AC

## 2020-06-30 NOTE — Discharge Instructions (Addendum)
Take 1000 mg of Tylenol along with the 600 mg of ibuprofen up to 3-4 times a day as needed.  Zanaflex will help with the muscle spasms.  Continue heat or ice, whichever feels better.  Go to [facility] on [address] for some deep tissue massage.

## 2020-06-30 NOTE — ED Triage Notes (Signed)
Pt here for back pain from muscle spasm that strain after working last few days

## 2020-06-30 NOTE — ED Provider Notes (Signed)
HPI  SUBJECTIVE:  Veronica Beasley is a 30 y.o. female who presents with bilateral upper back/trapezial pain starting yesterday.  Describes it as soreness, spasms.  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  No direct trauma, arm numbness or tingling, or weakness.  No fevers.  She tried sleeping flat, heat, ice, ibuprofen 600 mg every 4 to 6 hours.  Symptoms are better with staying still, worse with neck movement.  Past medical history negative for cervical strain.  LMP: Cannot remember, but denies the possibility of being pregnant.  PMD: [facility].   Past Medical History:  Diagnosis Date  . Anxiety   . Chlamydia   . Depression   . Hypoglycemia   . Hypoglycemia    pt just has a lot of protein snacks to manage this    Past Surgical History:  Procedure Laterality Date  . NO PAST SURGERIES      Family History  Problem Relation Age of Onset  . Hypertension Mother   . Other Neg Hx     Social History   Tobacco Use  . Smoking status: Former Games developer  . Smokeless tobacco: Never Used  . Tobacco comment: briefly at age 17  Substance Use Topics  . Alcohol use: Not Currently    Comment: occ  . Drug use: No    No current facility-administered medications for this encounter.  Current Outpatient Medications:  .  tiZANidine (ZANAFLEX) 4 MG tablet, Take 1 tablet (4 mg total) by mouth every 8 (eight) hours as needed for muscle spasms., Disp: 30 tablet, Rfl: 0 .  azelastine (ASTELIN) 0.1 % nasal spray, Place 1 spray into both nostrils 2 (two) times daily. Use in each nostril as directed, Disp: 30 mL, Rfl: 0 .  ibuprofen (ADVIL) 600 MG tablet, TAKE 1 TABLET BY MOUTH EVERY 4 TO 6 HOURS AS NEEDED PAIN, Disp: 30 tablet, Rfl: 1 .  ibuprofen (ADVIL,MOTRIN) 600 MG tablet, Take 1 tablet (600 mg total) by mouth every 6 (six) hours., Disp: 30 tablet, Rfl: 1 .  norethindrone-ethinyl estradiol-FE (LOESTRIN FE) 1-20 MG-MCG tablet, TAKE 1 TABLET BY MOUTH  ONCE A DAY, Disp: 28 tablet, Rfl: 12  Allergies  Allergen Reactions  . Food Hives and Itching    Ceasar Dressing     ROS  As noted in HPI.   Physical Exam  BP 129/87 (BP Location: Left Arm)   Pulse 74   Temp 98.1 F (36.7 C) (Oral)   Resp 18   SpO2 98%   Constitutional: Well developed, well nourished, no acute distress Eyes:  EOMI, conjunctiva normal bilaterally HENT: Normocephalic, atraumatic,mucus membranes moist Respiratory: Normal inspiratory effort Cardiovascular: Normal rate GI: nondistended skin: No rash, skin intact Musculoskeletal: No C-spine tenderness.  No trapezial, splenis capitis, strap muscle tenderness.  Positive spasm.  No meningismus.  pain with flexion/extension. Neurologic: Alert & oriented x 3, no focal neuro deficits Psychiatric: Speech and behavior appropriate   ED Course   Medications - No data to display  No orders of the defined types were placed in this encounter.   No results found for this or any previous visit (from the past 24 hour(s)). No results found.  ED Clinical Impression  1. Strain of neck muscle, initial encounter   2. Muscle spasm      ED Assessment/Plan  Patient with a cervical strain and trapezial muscle spasm.  Will send home with Tylenol/ibuprofen, Zanaflex, advised deep tissue massage.  Follow-up with PMD  as needed  Discussed MDM, treatment plan, and plan for follow-up with patient. . patient agrees with plan.   Meds ordered this encounter  Medications  . tiZANidine (ZANAFLEX) 4 MG tablet    Sig: Take 1 tablet (4 mg total) by mouth every 8 (eight) hours as needed for muscle spasms.    Dispense:  30 tablet    Refill:  0      *This clinic note was created using Scientist, clinical (histocompatibility and immunogenetics). Therefore, there may be occasional mistakes despite careful proofreading.  ?    Domenick Gong, MD 07/01/20 [phone]

## 2020-07-23 ENCOUNTER — Other Ambulatory Visit (HOSPITAL_COMMUNITY): Payer: Self-pay

## 2020-07-23 MED FILL — Norethindrone Ace & Ethinyl Estradiol-FE Tab 1 MG-20 MCG: ORAL | 84 days supply | Qty: 84 | Fill #1 | Status: AC

## 2020-10-13 ENCOUNTER — Other Ambulatory Visit (HOSPITAL_COMMUNITY): Payer: Self-pay

## 2020-10-13 MED FILL — Norethindrone Ace & Ethinyl Estradiol-FE Tab 1 MG-20 MCG: ORAL | 84 days supply | Qty: 84 | Fill #2 | Status: AC

## 2020-11-04 DIAGNOSIS — F419 Anxiety disorder, unspecified: Secondary | ICD-10-CM | POA: Diagnosis not present

## 2020-12-08 ENCOUNTER — Other Ambulatory Visit (HOSPITAL_COMMUNITY): Payer: Self-pay

## 2020-12-08 DIAGNOSIS — Z113 Encounter for screening for infections with a predominantly sexual mode of transmission: Secondary | ICD-10-CM | POA: Diagnosis not present

## 2020-12-08 DIAGNOSIS — Z6821 Body mass index (BMI) 21.0-21.9, adult: Secondary | ICD-10-CM | POA: Diagnosis not present

## 2020-12-08 DIAGNOSIS — Z309 Encounter for contraceptive management, unspecified: Secondary | ICD-10-CM | POA: Diagnosis not present

## 2020-12-08 DIAGNOSIS — Z01419 Encounter for gynecological examination (general) (routine) without abnormal findings: Secondary | ICD-10-CM | POA: Diagnosis not present

## 2020-12-08 MED ORDER — NORETHIN ACE-ETH ESTRAD-FE 1-20 MG-MCG PO TABS
ORAL_TABLET | ORAL | 3 refills | Status: DC
  Filled 2020-12-08 – 2021-01-14 (×2): qty 84, 84d supply, fill #0
  Filled 2021-03-30: qty 84, 84d supply, fill #1
  Filled 2021-07-06: qty 84, 84d supply, fill #2
  Filled 2021-09-22: qty 84, 84d supply, fill #3

## 2021-01-14 ENCOUNTER — Other Ambulatory Visit (HOSPITAL_COMMUNITY): Payer: Self-pay

## 2021-03-30 ENCOUNTER — Other Ambulatory Visit (HOSPITAL_COMMUNITY): Payer: Self-pay

## 2021-04-11 ENCOUNTER — Telehealth: Payer: 59 | Admitting: Physician Assistant

## 2021-04-11 ENCOUNTER — Other Ambulatory Visit (HOSPITAL_COMMUNITY): Payer: Self-pay

## 2021-04-11 DIAGNOSIS — K219 Gastro-esophageal reflux disease without esophagitis: Secondary | ICD-10-CM | POA: Diagnosis not present

## 2021-04-11 MED ORDER — OMEPRAZOLE 40 MG PO CPDR
40.0000 mg | DELAYED_RELEASE_CAPSULE | Freq: Every day | ORAL | 0 refills | Status: AC
  Filled 2021-04-11: qty 30, 30d supply, fill #0

## 2021-04-11 NOTE — Progress Notes (Signed)
E-Visit for Heartburn ? ?We are sorry that you are not feeling well.  Here is how we plan to help! ? ?Based on what you shared with me it looks like you most likely have Gastroesophageal Reflux Disease (GERD) ? ?Gastroesophageal reflux disease (GERD) happens when acid from your stomach flows up into the esophagus.  When acid comes in contact with the esophagus, the acid causes sorenss (inflammation) in the esophagus.  Over time, GERD may create small holes (ulcers) in the lining of the esophagus. ? ?I have prescribed Omeprazole 40 mg one by mouth daily until you follow up with a provider. ? ?Your symptoms should improve in the next day or two.  You can use antacids as needed until symptoms resolve.  Call us if your heartburn worsens, you have trouble swallowing, weight loss, spitting up blood or recurrent vomiting. ? ?Home Care: ?May include lifestyle changes such as weight loss, quitting smoking and alcohol consumption ?Avoid foods and drinks that make your symptoms worse, such as: ?Caffeine or alcoholic drinks ?Chocolate ?Peppermint or mint flavorings ?Garlic and onions ?Spicy foods ?Citrus fruits, such as oranges, lemons, or limes ?Tomato-based foods such as sauce, chili, salsa and pizza ?Fried and fatty foods ?Avoid lying down for 3 hours prior to your bedtime or prior to taking a nap ?Eat small, frequent meals instead of a large meals ?Wear loose-fitting clothing.  Do not wear anything tight around your waist that causes pressure on your stomach. ?Raise the head of your bed 6 to 8 inches with wood blocks to help you sleep.  Extra pillows will not help. ? ?Seek Help Right Away If: ?You have pain in your arms, neck, jaw, teeth or back ?Your pain increases or changes in intensity or duration ?You develop nausea, vomiting or sweating (diaphoresis) ?You develop shortness of breath or you faint ?Your vomit is green, yellow, black or looks like coffee grounds or blood ?Your stool is red, bloody or black ? ?These  symptoms could be signs of other problems, such as heart disease, gastric bleeding or esophageal bleeding. ? ?Make sure you : ?Understand these instructions. ?Will watch your condition. ?Will get help right away if you are not doing well or get worse. ? ?Your e-visit answers were reviewed by a board certified advanced clinical practitioner to complete your personal care plan.  Depending on the condition, your plan could have included both over the counter or prescription medications. ? ?If there is a problem please reply  once you have received a response from your provider. ? ?Your safety is important to [place].  If you have drug allergies check your prescription carefully.   ? ?You can use MyChart to ask questions about today?s visit, request a non-urgent call back, or ask for a work or school excuse for 24 hours related to this e-Visit. If it has been greater than 24 hours you will need to follow up with your provider, or enter a new e-Visit to address those concerns. ? ?You will get an e-mail in the next two days asking about your experience.  I hope that your e-visit has been valuable and will speed your recovery. Thank you for using e-visits. ? ? ?I provided 5 minutes of non face-to-face time during this encounter for chart review and documentation.  ? ?

## 2021-04-13 DIAGNOSIS — K219 Gastro-esophageal reflux disease without esophagitis: Secondary | ICD-10-CM | POA: Diagnosis not present

## 2021-04-13 DIAGNOSIS — Z0000 Encounter for general adult medical examination without abnormal findings: Secondary | ICD-10-CM | POA: Diagnosis not present

## 2021-07-06 ENCOUNTER — Other Ambulatory Visit (HOSPITAL_COMMUNITY): Payer: Self-pay

## 2021-08-11 ENCOUNTER — Other Ambulatory Visit (HOSPITAL_COMMUNITY): Payer: Self-pay

## 2021-08-11 DIAGNOSIS — R41840 Attention and concentration deficit: Secondary | ICD-10-CM | POA: Diagnosis not present

## 2021-08-11 DIAGNOSIS — F419 Anxiety disorder, unspecified: Secondary | ICD-10-CM | POA: Diagnosis not present

## 2021-08-11 MED ORDER — SERTRALINE HCL 50 MG PO TABS
ORAL_TABLET | ORAL | 0 refills | Status: DC
  Filled 2021-08-11: qty 60, 60d supply, fill #0

## 2021-09-22 ENCOUNTER — Other Ambulatory Visit (HOSPITAL_COMMUNITY): Payer: Self-pay

## 2021-10-11 ENCOUNTER — Other Ambulatory Visit (HOSPITAL_COMMUNITY): Payer: Self-pay

## 2021-10-11 DIAGNOSIS — F419 Anxiety disorder, unspecified: Secondary | ICD-10-CM | POA: Diagnosis not present

## 2021-10-11 DIAGNOSIS — F32A Depression, unspecified: Secondary | ICD-10-CM | POA: Diagnosis not present

## 2021-10-11 MED ORDER — HYDROXYZINE HCL 50 MG PO TABS
ORAL_TABLET | ORAL | 3 refills | Status: DC
  Filled 2021-10-11: qty 90, 90d supply, fill #0

## 2021-12-26 ENCOUNTER — Other Ambulatory Visit (HOSPITAL_COMMUNITY): Payer: Self-pay

## 2021-12-26 MED ORDER — NORETHIN ACE-ETH ESTRAD-FE 1-20 MG-MCG PO TABS
1.0000 | ORAL_TABLET | Freq: Every day | ORAL | 0 refills | Status: DC
  Filled 2021-12-26: qty 84, 84d supply, fill #0

## 2022-03-27 ENCOUNTER — Other Ambulatory Visit (HOSPITAL_COMMUNITY): Payer: Self-pay

## 2022-03-27 MED ORDER — NORETHIN ACE-ETH ESTRAD-FE 1-20 MG-MCG PO TABS
1.0000 | ORAL_TABLET | Freq: Every day | ORAL | 0 refills | Status: DC
  Filled 2022-03-27: qty 28, 28d supply, fill #0

## 2022-04-07 ENCOUNTER — Telehealth: Payer: Commercial Managed Care - PPO | Admitting: Physician Assistant

## 2022-04-07 DIAGNOSIS — A084 Viral intestinal infection, unspecified: Secondary | ICD-10-CM

## 2022-04-07 MED ORDER — ONDANSETRON HCL 4 MG PO TABS: 4.0000 mg | ORAL_TABLET | Freq: Three times a day (TID) | ORAL | 0 refills | Status: DC | PRN

## 2022-04-07 NOTE — Progress Notes (Signed)
We are sorry that you are not feeling well.  Here is how we plan to help!  Based on what you have shared with me it looks like you have Acute Infectious Diarrhea.  Most cases of acute diarrhea are due to infections with virus and bacteria and are self-limited conditions lasting less than 14 days.  For your symptoms you may take Imodium 2 mg tablets that are over the counter at your local pharmacy. Take two tablet now and then one after each loose stool up to 6 a day.  Antibiotics are not needed for most people with diarrhea.  Optional: Zofran 4 mg 1 tablet every 8 hours as needed for nausea and vomiting  HOME CARE We recommend changing your diet to help with your symptoms for the next few days. Drink plenty of fluids that contain water salt and sugar. Sports drinks such as Gatorade may help.  You may try broths, soups, bananas, applesauce, soft breads, mashed potatoes or crackers.  You are considered infectious for as long as the diarrhea continues. Hand washing or use of alcohol based hand sanitizers is recommend. It is best to stay out of work or school until your symptoms stop.   GET HELP RIGHT AWAY If you have dark yellow colored urine or do not pass urine frequently you should drink more fluids.   If your symptoms worsen  If you feel like you are going to pass out (faint) You have a new problem  MAKE SURE YOU  Understand these instructions. Will watch your condition. Will get help right away if you are not doing well or get worse.  Thank you for choosing an e-visit.  Your e-visit answers were reviewed by a board certified advanced clinical practitioner to complete your personal care plan. Depending upon the condition, your plan could have included both over the counter or prescription medications.  Please review your pharmacy choice. Make sure the pharmacy is open so you can pick up prescription now. If there is a problem, you may contact your provider through [employer]  and have the prescription routed to another pharmacy.  Your safety is important to [place]. If you have drug allergies check your prescription carefully.   For the next 24 hours you can use MyChart to ask questions about today's visit, request a non-urgent call back, or ask for a work or school excuse. You will get an email in the next two days asking about your experience. I hope that your e-visit has been valuable and will speed your recovery.  I have spent 5 minutes in review of e-visit questionnaire, review and updating patient chart, medical decision making and response to patient.   Mar Daring, PA-C

## 2022-04-20 ENCOUNTER — Other Ambulatory Visit (HOSPITAL_COMMUNITY): Payer: Self-pay

## 2022-04-20 DIAGNOSIS — Z6822 Body mass index (BMI) 22.0-22.9, adult: Secondary | ICD-10-CM | POA: Diagnosis not present

## 2022-04-20 DIAGNOSIS — Z309 Encounter for contraceptive management, unspecified: Secondary | ICD-10-CM | POA: Diagnosis not present

## 2022-04-20 DIAGNOSIS — Z01419 Encounter for gynecological examination (general) (routine) without abnormal findings: Secondary | ICD-10-CM | POA: Diagnosis not present

## 2022-04-20 DIAGNOSIS — G43829 Menstrual migraine, not intractable, without status migrainosus: Secondary | ICD-10-CM | POA: Diagnosis not present

## 2022-04-20 LAB — HM PAP SMEAR: HM Pap smear: NEGATIVE

## 2022-04-20 MED ORDER — LEVONORGEST-ETH ESTRAD 91-DAY 0.1-0.02 & 0.01 MG PO TABS
1.0000 | ORAL_TABLET | Freq: Every day | ORAL | 3 refills | Status: DC
  Filled 2022-04-20: qty 91, 91d supply, fill #0
  Filled 2022-07-14: qty 91, 91d supply, fill #1
  Filled 2022-10-11: qty 91, 91d supply, fill #2
  Filled 2023-01-08: qty 91, 91d supply, fill #3

## 2022-05-11 DIAGNOSIS — Z13220 Encounter for screening for lipoid disorders: Secondary | ICD-10-CM | POA: Diagnosis not present

## 2022-05-11 DIAGNOSIS — E559 Vitamin D deficiency, unspecified: Secondary | ICD-10-CM | POA: Diagnosis not present

## 2022-05-11 DIAGNOSIS — G4709 Other insomnia: Secondary | ICD-10-CM | POA: Diagnosis not present

## 2022-05-11 DIAGNOSIS — Z0000 Encounter for general adult medical examination without abnormal findings: Secondary | ICD-10-CM | POA: Diagnosis not present

## 2022-05-11 DIAGNOSIS — E538 Deficiency of other specified B group vitamins: Secondary | ICD-10-CM | POA: Diagnosis not present

## 2022-05-20 ENCOUNTER — Ambulatory Visit (HOSPITAL_COMMUNITY)
Admission: EM | Admit: 2022-05-20 | Discharge: 2022-05-20 | Disposition: A | Discharge status: Home / Self Care | Payer: Commercial Managed Care - PPO | Attending: Nurse Practitioner | Admitting: Nurse Practitioner

## 2022-05-20 ENCOUNTER — Encounter (HOSPITAL_COMMUNITY): Payer: Self-pay

## 2022-05-20 DIAGNOSIS — S8012XA Contusion of left lower leg, initial encounter: Secondary | ICD-10-CM | POA: Diagnosis not present

## 2022-05-20 DIAGNOSIS — M542 Cervicalgia: Secondary | ICD-10-CM

## 2022-05-20 MED ORDER — IBUPROFEN 800 MG PO TABS: 800.0000 mg | ORAL_TABLET | Freq: Three times a day (TID) | ORAL | 0 refills | Status: DC | PRN

## 2022-05-20 MED ORDER — TIZANIDINE HCL 4 MG PO TABS: 4.0000 mg | ORAL_TABLET | Freq: Three times a day (TID) | ORAL | 0 refills | Status: DC | PRN
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
## 2022-07-14 ENCOUNTER — Other Ambulatory Visit: Payer: Self-pay

## 2022-07-17 ENCOUNTER — Other Ambulatory Visit: Payer: Self-pay

## 2022-10-06 ENCOUNTER — Telehealth: Payer: Commercial Managed Care - PPO | Admitting: Family Medicine

## 2022-10-06 DIAGNOSIS — J069 Acute upper respiratory infection, unspecified: Secondary | ICD-10-CM | POA: Diagnosis not present

## 2022-10-06 MED ORDER — BENZONATATE 100 MG PO CAPS: 100.0000 mg | ORAL_CAPSULE | Freq: Two times a day (BID) | ORAL | 0 refills | Status: DC | PRN

## 2022-10-06 MED ORDER — FLUTICASONE PROPIONATE 50 MCG/ACT NA SUSP: 2.0000 | Freq: Every day | NASAL | 6 refills | Status: AC

## 2022-10-06 NOTE — Progress Notes (Signed)

## 2022-10-07 DIAGNOSIS — H6993 Unspecified Eustachian tube disorder, bilateral: Secondary | ICD-10-CM | POA: Diagnosis not present

## 2023-03-02 ENCOUNTER — Telehealth: Payer: Commercial Managed Care - PPO | Admitting: Physician Assistant

## 2023-03-02 DIAGNOSIS — B3731 Acute candidiasis of vulva and vagina: Secondary | ICD-10-CM

## 2023-03-02 MED ORDER — FLUCONAZOLE 150 MG PO TABS: 150.0000 mg | ORAL_TABLET | Freq: Once | ORAL | 0 refills | Status: AC

## 2023-03-02 NOTE — Progress Notes (Signed)

## 2023-03-30 ENCOUNTER — Other Ambulatory Visit (HOSPITAL_COMMUNITY): Payer: Self-pay

## 2023-03-30 ENCOUNTER — Ambulatory Visit: Payer: Commercial Managed Care - PPO | Admitting: Family Medicine

## 2023-03-30 ENCOUNTER — Encounter: Payer: Self-pay | Admitting: Family Medicine

## 2023-03-30 VITALS — BP 126/82 | HR 69 | Temp 98.6°F | Ht 66.0 in | Wt 152.3 lb

## 2023-03-30 DIAGNOSIS — G43009 Migraine without aura, not intractable, without status migrainosus: Secondary | ICD-10-CM | POA: Diagnosis not present

## 2023-03-30 MED ORDER — SUMATRIPTAN SUCCINATE 50 MG PO TABS
50.0000 mg | ORAL_TABLET | Freq: Every day | ORAL | 5 refills | Status: AC | PRN
  Filled 2023-03-30: qty 10, 10d supply, fill #0
  Filled 2023-07-27: qty 10, 30d supply, fill #1

## 2023-03-30 NOTE — Progress Notes (Signed)
 New Patient Office Visit  Subjective   Patient ID: Veronica Beasley, female    DOB: 11-22-1990  Age: 33 y.o. MRN: [id number]  CC:  Chief Complaint  Patient presents with   Establish Care    Pt is in office today to establish care. Pt [facility] because [facility] other doctors are with [facility] nd it is just easier as far as records.     HPI Veronica Beasley presents to establish care Was previously seen at [facility]. States that she does have a history of migraines occasionally, states that the last one she had was this week, states that she did get vertigo with this one. It made her a little nervous because she has never had vertigo with her migraines before. Usually gets them with her periods, was switched to OCP's taken for 84 days. Usually just takes OTC excedrin migraine and this usually helps.   Pt reports a history of anxiety, states that this problem has improved since completing grad school, takes an occasional hydroxyzine and this helps her. States that she sill occasionally have trouble sleeping due to the anxiety and the hydroxyzine makes it better.   I have reviewed all aspects of the patient's medical history including social, family, and surgical history.   Current Outpatient Medications  Medication Instructions   fluticasone (FLONASE) 50 MCG/ACT nasal spray 2 sprays, Each Nare, Daily   hydrOXYzine (ATARAX) 50 MG tablet Take 1/2 to 1 tablet by mouth  as needed once a day   ibuprofen (ADVIL) 800 mg, Oral, Every 8 hours PRN, Take with food to prevent GI upset   Levonorgestrel-Ethinyl Estradiol (CAMRESE LO) 0.1-0.02 & 0.01 MG tablet 1 tablet, Oral, Daily   omeprazole (PRILOSEC) 40 MG capsule Take 1 capsule by mouth daily.   ondansetron (ZOFRAN) 4 mg, Oral, Every 8 hours PRN   SUMAtriptan (IMITREX) 50 mg, Oral, Daily PRN, May repeat in 2 hours if headache persists or recurs.    Past Medical History:  Diagnosis Date   Anxiety    Chlamydia    Depression    Hypoglycemia     Hypoglycemia    pt just has a lot of protein snacks to manage this    Past Surgical History:  Procedure Laterality Date   NO PAST SURGERIES      Family History  Problem Relation Age of Onset   Hypertension Mother    Other Neg Hx     Social History   Socioeconomic History   Marital status: Single    Spouse name: Not on file   Number of children: Not on file   Years of education: Not on file   Highest education level: Master's degree (e.g., MA, MS, MEng, MEd, MSW, MBA)  Occupational History   Not on file  Tobacco Use   Smoking status: Former   Smokeless tobacco: Never   Tobacco comments:    briefly at age 43  Substance and Sexual Activity   Alcohol use: Not Currently    Comment: occ   Drug use: No   Sexual activity: Yes    Birth control/protection: Pill, None  Other Topics Concern   Not on file  Social History Narrative   Not on file   Social Drivers of Health   Financial Resource Strain: Low Risk  (03/28/2023)   Overall Financial Resource Strain (CARDIA)    Difficulty of Paying Living Expenses: Not hard at all  Food Insecurity: No Food Insecurity (03/28/2023)   Hunger Vital Sign  Worried About Programme researcher, broadcasting/film/video in the Last Year: Never true    Ran Out of Food in the Last Year: Never true  Transportation Needs: No Transportation Needs (03/28/2023)   PRAPARE - Administrator, Civil Service (Medical): No    Lack of Transportation (Non-Medical): No  Physical Activity: Sufficiently Active (03/28/2023)   Exercise Vital Sign    Days of Exercise per Week: 3 days    Minutes of Exercise per Session: 60 min  Stress: No Stress Concern Present (03/28/2023)   [employer] of Occupational Health - Occupational Stress Questionnaire    Feeling of Stress : Only a little  Social Connections: Moderately Integrated (03/28/2023)   Social Connection and Isolation Panel [NHANES]    Frequency of Communication with Friends and Family: More than three times a week     Frequency of Social Gatherings with Friends and Family: Once a week    Attends Religious Services: More than 4 times per year    Active Member of [employer] or Organizations: Yes    Attends Engineer, structural: More than 4 times per year    Marital Status: Never married  Intimate Partner Violence: Not on file    Review of Systems  All other systems reviewed and are negative.       Objective   BP 126/82   Pulse 69   Temp 98.6 F (37 C)   Ht 5\' 6"  (1.676 m)   Wt 152 lb 4.8 oz (69.1 kg)   SpO2 97%   BMI 24.58 kg/m   Physical Exam Vitals reviewed.  Constitutional:      Appearance: Normal appearance. She is well-groomed and normal weight.  Neck:     Thyroid: No thyromegaly.  Cardiovascular:     Rate and Rhythm: Normal rate and regular rhythm.     Pulses: Normal pulses.     Heart sounds: S1 normal and S2 normal.  Pulmonary:     Effort: Pulmonary effort is normal.     Breath sounds: Normal breath sounds and air entry.  Musculoskeletal:     Right lower leg: No edema.     Left lower leg: No edema.  Neurological:     Mental Status: She is alert and oriented to person, place, and time. Mental status is at baseline.     Gait: Gait is intact.  Psychiatric:        Mood and Affect: Mood and affect normal.        Speech: Speech normal.        Behavior: Behavior normal.        Judgment: Judgment normal.         Assessment & Plan:  Migraine without aura and without status migrainosus, not intractable -     SUMAtriptan Succinate; Take 1 tablet (50 mg total) by mouth daily as needed. May repeat in 2 hours if headache persists or recurs.  Dispense: 10 tablet; Refill: 5   Will treat PRN with sumatriptan, risks/benefits of the medication discussed with patient. She may also continue to use PRN excedrin, her migraines are only once every couple of months. I have reviewed all other aspects of her medical history, will see her back in 3 months for annual physical and  routine surveillance bloodwork.  Return in about 3 months (around 06/27/2023) for annual physical exam.   Karie Georges, MD

## 2023-04-24 ENCOUNTER — Other Ambulatory Visit (HOSPITAL_COMMUNITY): Payer: Self-pay

## 2023-04-24 MED ORDER — LEVONORGEST-ETH ESTRAD 91-DAY 0.1-0.02 & 0.01 MG PO TABS
1.0000 | ORAL_TABLET | Freq: Every day | ORAL | 0 refills | Status: DC
  Filled 2023-04-24: qty 91, 91d supply, fill #0

## 2023-05-14 ENCOUNTER — Other Ambulatory Visit (HOSPITAL_COMMUNITY): Payer: Self-pay

## 2023-05-14 DIAGNOSIS — Z113 Encounter for screening for infections with a predominantly sexual mode of transmission: Secondary | ICD-10-CM | POA: Diagnosis not present

## 2023-05-14 DIAGNOSIS — Z124 Encounter for screening for malignant neoplasm of cervix: Secondary | ICD-10-CM | POA: Diagnosis not present

## 2023-05-14 MED ORDER — LEVONORGEST-ETH ESTRAD 91-DAY 0.1-0.02 & 0.01 MG PO TABS
1.0000 | ORAL_TABLET | Freq: Every day | ORAL | 3 refills | Status: AC
  Filled 2023-05-14 – 2023-07-27 (×2): qty 91, 91d supply, fill #0
  Filled 2023-10-24: qty 91, 91d supply, fill #1
  Filled 2024-01-16: qty 91, 91d supply, fill #2

## 2023-05-21 DIAGNOSIS — F4389 Other reactions to severe stress: Secondary | ICD-10-CM | POA: Diagnosis not present

## 2023-06-27 ENCOUNTER — Encounter: Payer: Self-pay | Admitting: Family Medicine

## 2023-06-27 ENCOUNTER — Ambulatory Visit (INDEPENDENT_AMBULATORY_CARE_PROVIDER_SITE_OTHER): Payer: Commercial Managed Care - PPO | Admitting: Family Medicine

## 2023-06-27 VITALS — BP 102/76 | HR 70 | Temp 98.7°F | Ht 67.0 in | Wt 152.8 lb

## 2023-06-27 DIAGNOSIS — E559 Vitamin D deficiency, unspecified: Secondary | ICD-10-CM | POA: Diagnosis not present

## 2023-06-27 DIAGNOSIS — Z0000 Encounter for general adult medical examination without abnormal findings: Secondary | ICD-10-CM | POA: Diagnosis not present

## 2023-06-27 DIAGNOSIS — Z23 Encounter for immunization: Secondary | ICD-10-CM | POA: Diagnosis not present

## 2023-06-27 DIAGNOSIS — Z13220 Encounter for screening for lipoid disorders: Secondary | ICD-10-CM | POA: Diagnosis not present

## 2023-06-27 LAB — CBC WITH DIFFERENTIAL/PLATELET
Basophils Absolute: 0.1 10*3/uL (ref 0.0–0.1)
Basophils Relative: 0.9 % (ref 0.0–3.0)
Eosinophils Absolute: 0 10*3/uL (ref 0.0–0.7)
Eosinophils Relative: 0.4 % (ref 0.0–5.0)
HCT: 40.8 % (ref 36.0–46.0)
Hemoglobin: 13.5 g/dL (ref 12.0–15.0)
Lymphocytes Relative: 35.8 % (ref 12.0–46.0)
Lymphs Abs: 1.9 10*3/uL (ref 0.7–4.0)
MCHC: 33.1 g/dL (ref 30.0–36.0)
MCV: 85.8 fl (ref 78.0–100.0)
Monocytes Absolute: 0.3 10*3/uL (ref 0.1–1.0)
Monocytes Relative: 6.4 % (ref 3.0–12.0)
Neutro Abs: 3 10*3/uL (ref 1.4–7.7)
Neutrophils Relative %: 56.5 % (ref 43.0–77.0)
Platelets: 263 10*3/uL (ref 150.0–400.0)
RBC: 4.75 Mil/uL (ref 3.87–5.11)
RDW: 12.9 % (ref 11.5–15.5)
WBC: 5.4 10*3/uL (ref 4.0–10.5)

## 2023-06-27 LAB — COMPREHENSIVE METABOLIC PANEL WITH GFR
ALT: 8 U/L (ref 0–35)
AST: 13 U/L (ref 0–37)
Albumin: 4.5 g/dL (ref 3.5–5.2)
Alkaline Phosphatase: 39 U/L (ref 39–117)
BUN: 16 mg/dL (ref 6–23)
CO2: 27 meq/L (ref 19–32)
Calcium: 9.5 mg/dL (ref 8.4–10.5)
Chloride: 102 meq/L (ref 96–112)
Creatinine, Ser: 0.77 mg/dL (ref 0.40–1.20)
GFR: 100.98 mL/min (ref >60.00)
Glucose, Bld: 81 mg/dL (ref 70–99)
Potassium: 4 meq/L (ref 3.5–5.1)
Sodium: 136 meq/L (ref 135–145)
Total Bilirubin: 0.5 mg/dL (ref 0.2–1.2)
Total Protein: 7.4 g/dL (ref 6.0–8.3)

## 2023-06-27 LAB — LIPID PANEL
Cholesterol: 187 mg/dL (ref 0–200)
HDL: 94.6 mg/dL (ref >39.00)
LDL Cholesterol: 81 mg/dL (ref 0–99)
NonHDL: 92.09
Total CHOL/HDL Ratio: 2
Triglycerides: 53 mg/dL (ref 0.0–149.0)
VLDL: 10.6 mg/dL (ref 0.0–40.0)

## 2023-06-27 LAB — VITAMIN D 25 HYDROXY (VIT D DEFICIENCY, FRACTURES): VITD: 42.42 ng/mL (ref 30.00–100.00)

## 2023-06-27 NOTE — Patient Instructions (Signed)

## 2023-06-27 NOTE — Progress Notes (Signed)
 Complete physical exam  Patient: Veronica Beasley   DOB: 1991-01-01   33 y.o. Female  MRN: [id number]  Subjective:     Chief Complaint  Patient presents with   Annual Exam   Grief    Patient states her grandmother, who was like a mother-figure passed away in March    Veronica Beasley is a 33 y.o. female who presents today for a complete physical exam. She reports consuming a general and avoids dairy due to stomach upset/bloating diet. Gym/ health club routine includes cardio, light weights, and yoga. She generally feels well. She reports sleeping fairly well, recently lost her grandmother in March so is still grieving. She does not have additional problems to discuss today.   Takes vitamin D, women's one a day, no herbal supplements.   Most recent fall risk assessment:     No data to display           Most recent depression screenings:    06/27/2023    9:45 AM  PHQ 2/9 Scores  PHQ - 2 Score 2  PHQ- 9 Score 5    Vision:Within last year and wears glasses/contacts and Dental: No current dental problems and Receives regular dental care  Patient Active Problem List   Diagnosis Date Noted   Pregnancy 11/03/2013   Indication for care in labor or delivery 11/02/2013      Patient Care Team: Aida House, MD as PCP - General (Family Medicine)   Outpatient Medications Prior to Visit  Medication Sig   fluticasone  (FLONASE ) 50 MCG/ACT nasal spray Place 2 sprays into both nostrils daily.   hydrOXYzine  (ATARAX ) 50 MG tablet Take 1/2 to 1 tablet by mouth  as needed once a day   Levonorgestrel -Ethinyl Estradiol  (LOJAIMIESS ) 0.1-0.02 & 0.01 MG tablet Take 1 tablet by mouth daily.   omeprazole  (PRILOSEC) 40 MG capsule Take 1 capsule by mouth daily.   SUMAtriptan  (IMITREX ) 50 MG tablet Take 1 tablet (50 mg total) by mouth daily as needed. May repeat in 2 hours if headache persists or recurs.   [DISCONTINUED] ibuprofen  (ADVIL ) 800 MG tablet Take 1 tablet (800 mg total)  by mouth every 8 (eight) hours as needed. Take with food to prevent GI upset   [DISCONTINUED] ondansetron  (ZOFRAN ) 4 MG tablet Take 1 tablet (4 mg total) by mouth every 8 (eight) hours as needed for nausea or vomiting.   No facility-administered medications prior to visit.    Review of Systems  HENT:  Negative for hearing loss.   Eyes:  Negative for blurred vision.  Respiratory:  Negative for shortness of breath.   Cardiovascular:  Negative for chest pain.  Gastrointestinal: Negative.   Genitourinary: Negative.   Musculoskeletal:  Negative for back pain.  Neurological:  Negative for headaches.  Psychiatric/Behavioral:  Negative for depression.   All other systems reviewed and are negative.      Objective:     BP 102/76   Pulse 70   Temp 98.7 F (37.1 C) (Oral)   Ht 5\' 7"  (1.702 m)   Wt 152 lb 12.8 oz (69.3 kg)   LMP 06/20/2023 (Exact Date)   SpO2 98%   BMI 23.93 kg/m    Physical Exam Vitals reviewed.  Constitutional:      Appearance: Normal appearance. She is well-groomed and normal weight.  HENT:     Right Ear: Tympanic membrane and ear canal normal.     Left Ear: Tympanic membrane and ear canal normal.  Mouth/Throat:     Mouth: Mucous membranes are moist.     Pharynx: No posterior oropharyngeal erythema.  Eyes:     Conjunctiva/sclera: Conjunctivae normal.  Neck:     Thyroid: No thyromegaly.  Cardiovascular:     Rate and Rhythm: Normal rate and regular rhythm.     Pulses: Normal pulses.     Heart sounds: S1 normal and S2 normal.  Pulmonary:     Effort: Pulmonary effort is normal.     Breath sounds: Normal breath sounds and air entry.  Abdominal:     General: Abdomen is flat. Bowel sounds are normal.     Palpations: Abdomen is soft.  Musculoskeletal:     Right lower leg: No edema.     Left lower leg: No edema.  Lymphadenopathy:     Cervical: No cervical adenopathy.  Neurological:     Mental Status: She is alert and oriented to person, place, and  time. Mental status is at baseline.     Gait: Gait is intact.  Psychiatric:        Mood and Affect: Mood and affect normal.        Speech: Speech normal.        Behavior: Behavior normal.        Judgment: Judgment normal.      No results found for any visits on 06/27/23.     Assessment & Plan:    Routine Health Maintenance and Physical Exam  Immunization History  Administered Date(s) Administered   Influenza,inj,Quad PF,6+ Mos 11/04/2013    Health Maintenance  Topic Date Due   DTaP/Tdap/Td (1 - Tdap) Never done   COVID-19 Vaccine (1 - 2024-25 season) Never done   Hepatitis C Screening  06/26/2024 (Originally 07/19/2007)   INFLUENZA VACCINE  09/07/2023   Cervical Cancer Screening (HPV/Pap Cotest)  04/19/2025   HIV Screening  Completed   HPV VACCINES  Aged Out   Meningococcal B Vaccine  Aged Out    Discussed health benefits of physical activity, and encouraged her to engage in regular exercise appropriate for her age and condition.  Immunization due -     Tdap vaccine greater than or equal to 7yo IM  Lipid screening -     Lipid panel; Future  Vitamin D deficiency -     VITAMIN D 25 Hydroxy (Vit-D Deficiency, Fractures); Future  Routine general medical examination at a health care facility -     CBC with Differential/Platelet; Future -     Comprehensive metabolic panel with GFR; Future   Normal physical exam findings today. Counseled the patient on healthy sleep habits, handouts given on healthy eating and exercise, reviewed health maintenance and she is due for her tdap booster, counseling given on the vaccination. Labs ordered for annual surveillance. RTC yearly for annual physical Return in 1 year (on 06/26/2024).     Aida House, MD

## 2023-06-28 ENCOUNTER — Ambulatory Visit: Payer: Self-pay | Admitting: Family Medicine

## 2023-06-29 DIAGNOSIS — F4389 Other reactions to severe stress: Secondary | ICD-10-CM | POA: Diagnosis not present

## 2023-07-27 ENCOUNTER — Other Ambulatory Visit: Payer: Self-pay

## 2023-07-27 ENCOUNTER — Other Ambulatory Visit (HOSPITAL_COMMUNITY): Payer: Self-pay

## 2023-07-28 ENCOUNTER — Other Ambulatory Visit (HOSPITAL_COMMUNITY): Payer: Self-pay

## 2023-11-14 ENCOUNTER — Other Ambulatory Visit (HOSPITAL_BASED_OUTPATIENT_CLINIC_OR_DEPARTMENT_OTHER): Payer: Self-pay

## 2023-11-14 MED ORDER — FLUZONE 0.5 ML IM SUSY
0.5000 mL | PREFILLED_SYRINGE | Freq: Once | INTRAMUSCULAR | 0 refills | Status: AC
  Filled 2023-11-14: qty 0.5, 1d supply, fill #0

## 2023-12-21 ENCOUNTER — Encounter: Payer: Self-pay | Admitting: Family Medicine

## 2023-12-21 ENCOUNTER — Ambulatory Visit: Admitting: Family Medicine

## 2023-12-21 ENCOUNTER — Other Ambulatory Visit (HOSPITAL_COMMUNITY): Payer: Self-pay

## 2023-12-21 VITALS — BP 100/78 | HR 77 | Temp 99.2°F | Ht 67.0 in | Wt 158.2 lb

## 2023-12-21 DIAGNOSIS — H66001 Acute suppurative otitis media without spontaneous rupture of ear drum, right ear: Secondary | ICD-10-CM

## 2023-12-21 DIAGNOSIS — J0110 Acute frontal sinusitis, unspecified: Secondary | ICD-10-CM

## 2023-12-21 DIAGNOSIS — F419 Anxiety disorder, unspecified: Secondary | ICD-10-CM | POA: Diagnosis not present

## 2023-12-21 MED ORDER — HYDROXYZINE HCL 50 MG PO TABS
25.0000 mg | ORAL_TABLET | Freq: Every day | ORAL | 3 refills | Status: AC | PRN
  Filled 2023-12-21: qty 90, 90d supply, fill #0

## 2023-12-21 MED ORDER — FLUCONAZOLE 150 MG PO TABS
150.0000 mg | ORAL_TABLET | ORAL | 0 refills | Status: AC | PRN
  Filled 2023-12-21: qty 2, 6d supply, fill #0

## 2023-12-21 MED ORDER — AMOXICILLIN-POT CLAVULANATE 875-125 MG PO TABS
1.0000 | ORAL_TABLET | Freq: Two times a day (BID) | ORAL | 0 refills | Status: AC
  Filled 2023-12-21: qty 14, 7d supply, fill #0

## 2023-12-21 NOTE — Progress Notes (Signed)
 Acute Office Visit  Subjective:     Patient ID: Veronica Beasley, female    DOB: 02/04/1991, 33 y.o.   MRN: [id number]  Chief Complaint  Patient presents with   Sinus Problem    Clear-yellow nasal drainage x1 week, green in color x3 days, states she tried saline rinses with no relief   Cough    Productive with green sputum x3 days    Sinus Problem Associated symptoms include coughing. Pertinent negatives include no chills or ear pain.  Cough Pertinent negatives include no chills, ear pain or fever.   Discussed the use of AI scribe software for clinical note transcription with the patient, who gave verbal consent to proceed.  History of Present Illness   Veronica Beasley is a 33 year old female with seasonal allergies and recurrent sinus infections who presents with sinus issues.  She has experienced green nasal drainage for the last three days, unrelieved by saline rinses. Symptoms began last week with clear mucus, progressing to yellow and then green. She experiences postnasal drip, frequent throat clearing, and a choking sensation. There is sinus and ear pressure, with attempts to relieve ear pressure by blowing, causing a 'whoosh' sensation and occasional imbalance. No fever, chills, or body aches are present. There is no sore throat, only irritation. Her preschooler has a slight runny nose with clear mucus, typical for this time of year. She typically seeks medical attention when mucus turns green due to a history of seasonal allergies and recurrent sinus infections. She is currently taking hydroxyzine  as needed for anxiety.       Review of Systems  Constitutional:  Negative for chills and fever.  HENT:  Negative for ear discharge and ear pain.   Respiratory:  Positive for cough.         Objective:    BP 100/78   Pulse 77   Temp 99.2 F (37.3 C) (Oral)   Ht 5' 7 (1.702 m)   Wt 158 lb 3.2 oz (71.8 kg)   SpO2 99%   BMI 24.78 kg/m    Physical Exam Vitals  reviewed.  Constitutional:      Appearance: Normal appearance. She is normal weight.  HENT:     Right Ear: Tympanic membrane is injected.     Left Ear: Tympanic membrane normal.  Cardiovascular:     Rate and Rhythm: Normal rate and regular rhythm.     Heart sounds: Normal heart sounds. No murmur heard. Pulmonary:     Effort: Pulmonary effort is normal.     Breath sounds: Normal breath sounds. No wheezing.  Lymphadenopathy:     Cervical:     Right cervical: No superficial cervical adenopathy.    Left cervical: No superficial cervical adenopathy.  Neurological:     Mental Status: She is alert.     No results found for any visits on 12/21/23.      Assessment & Plan:   Problem List Items Addressed This Visit   None Visit Diagnoses       Acute non-recurrent frontal sinusitis    -  Primary   Relevant Medications   amoxicillin -clavulanate (AUGMENTIN ) 875-125 MG tablet   fluconazole  (DIFLUCAN ) 150 MG tablet     Non-recurrent acute suppurative otitis media of right ear without spontaneous rupture of tympanic membrane       Relevant Medications   amoxicillin -clavulanate (AUGMENTIN ) 875-125 MG tablet   fluconazole  (DIFLUCAN ) 150 MG tablet      Assessment and Plan  Acute frontal sinusitis and acute suppurative otitis media, right ear Symptoms have worsened over the past week with green nasal drainage, postnasal drip, and ear pressure. No fever or body aches, suggesting a bacterial rather than viral etiology. Examination reveals redness and bulging of the right ear, indicating an ear infection, and sinus tenderness, suggesting sinusitis. Augmentin  is chosen for its efficacy in treating both sinusitis and otitis media due to good bone penetration and coverage of common pathogens. - Prescribed Augmentin  for sinusitis and otitis media. - Advised continuation of over-the-counter decongestants for pressure relief. - Instructed to complete the full course of antibiotics even if  symptoms improve. - Prescribed Diflucan  to be taken at the end of the antibiotic course to prevent yeast infection.  Anxiety disorder Managed with hydroxyzine  as needed. She reports doing well but requests a refill due to upcoming holidays and family gatherings. - Prescribed hydroxyzine  for anxiety, to be taken as needed.       Meds ordered this encounter  Medications   amoxicillin -clavulanate (AUGMENTIN ) 875-125 MG tablet    Sig: Take 1 tablet by mouth 2 (two) times daily for 7 days.    Dispense:  14 tablet    Refill:  0   fluconazole  (DIFLUCAN ) 150 MG tablet    Sig: Take 1 tablet (150 mg total) by mouth every three (3) days as needed.    Dispense:  2 tablet    Refill:  0    No follow-ups on file.  Heron CHRISTELLA Sharper, MD

## 2024-07-01 ENCOUNTER — Encounter: Admitting: Family Medicine
